# Patient Record
Sex: Female | Born: 1935 | Race: White | Hispanic: No | Marital: Married | Smoking: Never smoker
Health system: Southern US, Community
[De-identification: ages and names within clinical notes are randomized; demographics above are authoritative.]

## PROBLEM LIST (undated history)

## (undated) DIAGNOSIS — F0390 Unspecified dementia without behavioral disturbance: Secondary | ICD-10-CM

## (undated) DIAGNOSIS — K922 Gastrointestinal hemorrhage, unspecified: Secondary | ICD-10-CM

## (undated) DIAGNOSIS — O2230 Deep phlebothrombosis in pregnancy, unspecified trimester: Secondary | ICD-10-CM

## (undated) DIAGNOSIS — I10 Essential (primary) hypertension: Secondary | ICD-10-CM

## (undated) DIAGNOSIS — R42 Dizziness and giddiness: Secondary | ICD-10-CM

## (undated) DIAGNOSIS — I447 Left bundle-branch block, unspecified: Secondary | ICD-10-CM

## (undated) HISTORY — PX: ABDOMINAL HYSTERECTOMY: SHX81

---

## 2007-12-05 ENCOUNTER — Encounter: Admission: RE | Admit: 2007-12-05 | Discharge: 2007-12-05 | Payer: Self-pay | Admitting: Geriatric Medicine

## 2008-03-05 ENCOUNTER — Inpatient Hospital Stay (HOSPITAL_COMMUNITY): Admission: EM | Admit: 2008-03-05 | Discharge: 2008-03-08 | Payer: Self-pay | Admitting: Emergency Medicine

## 2010-01-27 ENCOUNTER — Encounter: Admission: RE | Admit: 2010-01-27 | Discharge: 2010-01-27 | Payer: Self-pay | Admitting: Geriatric Medicine

## 2010-07-01 ENCOUNTER — Inpatient Hospital Stay (HOSPITAL_COMMUNITY)
Admission: EM | Admit: 2010-07-01 | Discharge: 2010-07-05 | DRG: 312 | Disposition: A | Discharge status: Skilled Nursing Facility | Payer: Medicare Other | Attending: Internal Medicine | Admitting: Internal Medicine

## 2010-07-01 ENCOUNTER — Emergency Department (HOSPITAL_COMMUNITY): Payer: Medicare Other

## 2010-07-01 DIAGNOSIS — I428 Other cardiomyopathies: Secondary | ICD-10-CM | POA: Diagnosis present

## 2010-07-01 DIAGNOSIS — R42 Dizziness and giddiness: Secondary | ICD-10-CM

## 2010-07-01 DIAGNOSIS — W19XXXA Unspecified fall, initial encounter: Secondary | ICD-10-CM | POA: Diagnosis present

## 2010-07-01 DIAGNOSIS — F068 Other specified mental disorders due to known physiological condition: Secondary | ICD-10-CM | POA: Diagnosis present

## 2010-07-01 DIAGNOSIS — R55 Syncope and collapse: Principal | ICD-10-CM | POA: Diagnosis present

## 2010-07-01 DIAGNOSIS — I10 Essential (primary) hypertension: Secondary | ICD-10-CM | POA: Diagnosis present

## 2010-07-01 DIAGNOSIS — N390 Urinary tract infection, site not specified: Secondary | ICD-10-CM | POA: Diagnosis present

## 2010-07-01 DIAGNOSIS — E785 Hyperlipidemia, unspecified: Secondary | ICD-10-CM | POA: Diagnosis present

## 2010-07-01 DIAGNOSIS — Z7901 Long term (current) use of anticoagulants: Secondary | ICD-10-CM

## 2010-07-01 DIAGNOSIS — Z86718 Personal history of other venous thrombosis and embolism: Secondary | ICD-10-CM

## 2010-07-01 DIAGNOSIS — R079 Chest pain, unspecified: Secondary | ICD-10-CM

## 2010-07-01 DIAGNOSIS — R0789 Other chest pain: Secondary | ICD-10-CM | POA: Diagnosis present

## 2010-07-01 LAB — URINE MICROSCOPIC-ADD ON

## 2010-07-01 LAB — CBC
HCT: 39 % (ref 36.0–46.0)
MCH: 31.5 pg (ref 26.0–34.0)
Platelets: 137 10*3/uL — ABNORMAL LOW (ref 150–400)
RDW: 13.7 % (ref 11.5–15.5)
WBC: 7.4 10*3/uL (ref 4.0–10.5)

## 2010-07-01 LAB — POCT I-STAT, CHEM 8
BUN: 21 mg/dL (ref 6–23)
Calcium, Ion: 1.18 mmol/L (ref 1.12–1.32)
Chloride: 110 mEq/L (ref 96–112)
Creatinine, Ser: 1.2 mg/dL (ref 0.4–1.2)
Glucose, Bld: 109 mg/dL — ABNORMAL HIGH (ref 70–99)
Sodium: 141 mEq/L (ref 135–145)
TCO2: 23 mmol/L (ref 0–100)

## 2010-07-01 LAB — DIFFERENTIAL
Basophils Absolute: 0 10*3/uL (ref 0.0–0.1)
Eosinophils Absolute: 0 10*3/uL (ref 0.0–0.7)
Eosinophils Relative: 0 % (ref 0–5)
Lymphocytes Relative: 8 % — ABNORMAL LOW (ref 12–46)
Monocytes Relative: 7 % (ref 3–12)
Neutro Abs: 6.2 10*3/uL (ref 1.7–7.7)
Neutrophils Relative %: 84 % — ABNORMAL HIGH (ref 43–77)

## 2010-07-01 LAB — PROTIME-INR
INR: 2.08 — ABNORMAL HIGH (ref 0.00–1.49)
Prothrombin Time: 23.5 seconds — ABNORMAL HIGH (ref 11.6–15.2)

## 2010-07-01 LAB — CK TOTAL AND CKMB (NOT AT ARMC)
CK, MB: 3.9 ng/mL (ref 0.3–4.0)
Relative Index: 2.4 (ref 0.0–2.5)
Total CK: 162 U/L (ref 7–177)

## 2010-07-01 LAB — D-DIMER, QUANTITATIVE: D-Dimer, Quant: 9.36 ug/mL-FEU — ABNORMAL HIGH (ref 0.00–0.48)

## 2010-07-01 LAB — URINALYSIS, ROUTINE W REFLEX MICROSCOPIC
Nitrite: NEGATIVE
Protein, ur: NEGATIVE mg/dL
Urobilinogen, UA: 0.2 mg/dL (ref 0.0–1.0)

## 2010-07-01 LAB — POCT CARDIAC MARKERS: Myoglobin, poc: 219 ng/mL (ref 12–200)

## 2010-07-01 LAB — TROPONIN I: Troponin I: 0.01 ng/mL (ref 0.00–0.06)

## 2010-07-02 DIAGNOSIS — I10 Essential (primary) hypertension: Secondary | ICD-10-CM

## 2010-07-02 DIAGNOSIS — I059 Rheumatic mitral valve disease, unspecified: Secondary | ICD-10-CM

## 2010-07-02 DIAGNOSIS — I951 Orthostatic hypotension: Secondary | ICD-10-CM

## 2010-07-02 LAB — BASIC METABOLIC PANEL
CO2: 21 mEq/L (ref 19–32)
Calcium: 8.9 mg/dL (ref 8.4–10.5)
GFR calc Af Amer: >60 mL/min (ref >60)
Sodium: 140 mEq/L (ref 135–145)

## 2010-07-02 LAB — CK TOTAL AND CKMB (NOT AT ARMC)
CK, MB: 2.6 ng/mL (ref 0.3–4.0)
Relative Index: 2.2 (ref 0.0–2.5)
Total CK: 132 U/L (ref 7–177)
Total CK: 117 U/L (ref 7–177)

## 2010-07-02 LAB — CBC
HCT: 36.3 % (ref 36.0–46.0)
MCHC: 32.8 g/dL (ref 30.0–36.0)
MCV: 96.3 fL (ref 78.0–100.0)
RDW: 14.2 % (ref 11.5–15.5)
WBC: 5.1 10*3/uL (ref 4.0–10.5)

## 2010-07-02 LAB — PROTIME-INR
INR: 2.23 — ABNORMAL HIGH (ref 0.00–1.49)
Prothrombin Time: 24.8 seconds — ABNORMAL HIGH (ref 11.6–15.2)

## 2010-07-03 DIAGNOSIS — I359 Nonrheumatic aortic valve disorder, unspecified: Secondary | ICD-10-CM

## 2010-07-03 LAB — PROTIME-INR
INR: 2.55 — ABNORMAL HIGH (ref 0.00–1.49)
Prothrombin Time: 27.5 seconds — ABNORMAL HIGH (ref 11.6–15.2)

## 2010-07-03 LAB — URINE CULTURE: Culture  Setup Time: 201202021653

## 2010-07-04 LAB — PROTIME-INR
INR: 2.58 — ABNORMAL HIGH (ref 0.00–1.49)
Prothrombin Time: 27.8 seconds — ABNORMAL HIGH (ref 11.6–15.2)

## 2010-07-05 DIAGNOSIS — I5021 Acute systolic (congestive) heart failure: Secondary | ICD-10-CM

## 2010-07-05 LAB — PROTIME-INR: Prothrombin Time: 27.4 seconds — ABNORMAL HIGH (ref 11.6–15.2)

## 2010-07-10 NOTE — H&P (Signed)
NAMEJENETTE, RAYSON NO.:  [id number]  MEDICAL RECORD NO.:  [id number]           PATIENT TYPE:  E  LOCATION:  [facility]                         FACILITY:  [facility]  PHYSICIAN:  Lonia Blood, M.D.       DATE OF BIRTH:  [birth year]  DATE OF ADMISSION:  07/01/2010 DATE OF DISCHARGE:                             HISTORY & PHYSICAL   PRIMARY CARE PHYSICIAN:  Hal T. Stoneking, MD  CHIEF COMPLAINT:  Fall.  HISTORY OF PRESENT ILLNESS:  Mrs. Alois Cliche is a 77 year old woman with history of DVT, PE, IVC filter, mild dementia, who reports that she fell at home after waking up from a nap.  She said that she woke up from a nap and felt really dizzy and when she tried to get up she got really, really dizzy and then fell down to the floor.  She then has been experiencing some sharp left-sided chest pain every time she takes a deep breath.  She was taken to the emergency room where she had a chest x-ray not showing any pneumothorax or fractured ribs.  She currently has no dizziness, focal weakness, or numbness, or shortness of breath.  PAST MEDICAL HISTORY:  DVT, PE, status post IVC filter, history of a GI bleed from gastric erosions, bilateral hydronephrosis, and obstructive renal calculi, hypertension, hyperlipidemia, mild dementia.  HOME MEDICATIONS:  Unclear.  She thinks she is still taking Coumadin but she does not know any of the others.  She reports she gave a list to EMS but unfortunately the list did not make it to the hospital.  We have a call in to Dr. Tacy Learn office to get the medication list.  ALLERGIES:  Penicillin.  SOCIAL HISTORY:  The patient is married.  She lives in the independent retirement community of [place].  Her husband is currently in a nursing home for short-term rehabilitation.  She does not smoke, does not drink any alcohol.  FAMILY HISTORY:  Positive for hypertension.  REVIEW OF SYSTEMS:  As per HPI.  All other systems reviewed  are negative.  PHYSICAL EXAMINATION:  VITAL SIGNS:  Upon admission temperature is 97.4, heart rate 63, blood pressure 150/80, respirations 16, saturation 94-95% on room air. GENERAL:  The patient is alert, oriented, in no acute distress. HEENT:  Head is normocephalic, atraumatic.  Eyes, pupils equal, round, reactive to light and accommodation.  Extraocular movement is intact. Throat clear. NECK:  Supple.  No JVD. CHEST:  Clear to auscultation without wheezes, rhonchi, or crackles. HEART:  Regular rate and rhythm without murmurs, rubs, or gallops. ABDOMEN:  Soft, nontender, nondistended.  Bowel sounds are present. EXTREMITIES:  Lower extremities without edema. SKIN:  Warm, dry.  No suspicious-looking rashes.  LABORATORY VALUES ON ADMISSION:  White blood cell count 7.4, hemoglobin 13, platelet count 137.  Sodium 141, potassium 4, BUN 21, creatinine 1.2.  Urinalysis positive for leukocyte esterase, bacteria.  Two sets of cardiac enzymes are within normal limits.  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  Chest x-ray shows no infiltrates.  ASSESSMENT AND PLAN: 1. This is a 77 year old woman presenting after a fall that was     preceded by some dizziness.  Her dizziness could be a     cerebrovascular event, but I feel like that is unlikely.  She could     have vertebrobasilar insufficiency or orthostasis.  I am going to     go ahead and admit her to the hospital, check orthostatic vital     signs, obtain home medications, monitor her neurological status     closely.  Obtain Physical Therapy and Occupational Therapy     evaluation. 2. Chest pain.  This is consistent with musculoskeletal type chest     pain from the injury.  I am worried about possibility of a rib     fracture.  I am going to obtain a chest x-ray with rib detail film,     start Tylenol  round-the-clock, use low-dose narcotic. 3. History of deep vein thrombosis, pulmonary embolism.  The patient     clearly states that the chest pain was after a fall not before the     fall.  Nevertheless, with a history of thromboembolic disease, this     is always a possibility.  I am going to check a D-dimer.  Check     PT/INR to see if the Coumadin is therapeutic and obtain CT     angiogram of the chest if INR is low, D-dimer is very high. 4. Reported mild dementia.  The patient seems to be quite focused,     oriented, able to answer commands and knows what is going on with     her husband as well as her family.  If she has dementia, it is     definitely very mild.     Lonia Blood, M.D.     SL/MEDQ  D:  07/01/2010  T:  07/01/2010  Job:  [id number]  cc:   Hal T. Stoneking, M.D.  Electronically Signed by Lonia Blood M.D. on 07/05/2010 07:39:44 AM

## 2010-07-13 NOTE — Discharge Summary (Signed)
NAMEREVER, PICHETTE NO.:  [id number]  MEDICAL RECORD NO.:  [id number]           PATIENT TYPE:  I  LOCATION:  4502                         FACILITY:  [facility]  PHYSICIAN:  Lonia Blood, M.D.       DATE OF BIRTH:  [birth year]  DATE OF ADMISSION:  07/01/2010 DATE OF DISCHARGE:  07/05/2010                              DISCHARGE SUMMARY   PRIMARY CARE PHYSICIAN:  Hal T. Stoneking, MD  DISCHARGE DIAGNOSES: 1. Fall. 2. Probable syncope. 3. Newly-diagnosed cardiomyopathy with ejection fraction of 30% and     probably significant aortic stenosis as well as pulmonary     hypertension - no further plans for workup per the patient and     family. 4. History of deep venous thrombosis and pulmonary embolism with IVC     filter in, on chronic anticoagulation. 5. Urinary tract infection. 6. Mild-to-moderate dementia. 7. History of gastrointestinal bleed. 8. Hypertension. 9. Hyperlipidemia.  DISCHARGE MEDICATIONS: 1. Tylenol 500 mg by mouth three times a day. 2. Cefuroxime 500 mg by mouth twice a day for 2 days. 3. Colace 100 mg by mouth twice a day. 4. Aricept 10 mg by mouth at bedtime. 5. Ensure 1 can by mouth three times a day. 6. Crestor 20 mg daily. 7. Zoloft 50 mg daily. 8. Tylenol with Codeine 300/30 one tablet by mouth every 6 hours as     needed for pain. 9. Coumadin 3 mg daily.  CONDITION ON DISCHARGE:  Ms. Glorie Dowlen will be discharged to skilled nursing [facility] where she will be reunited with her husband.  She is do not resuscitate as per her wishes as well as her family wishes.  PROCEDURE THIS ADMISSION: 1. 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 2. Chest x-ray, two views, with no active disease. 3. Transthoracic echocardiogram on July 02, 2010 with  ejection     fraction of 30% with diffuse hypokinesis, probable severe aortic     stenosis, pulmonary artery pressure of 51 mmHg, abnormal left     ventricular relaxation, grade 1 diastolic dysfunction.  HISTORY AND PHYSICAL:  Refer to dictated H and P by Dr. Lavera Guise.  CONSULTATIONS:  The patient was seen in consultation by [facility]  HOSPITAL COURSE: 1. Ms. Alois Cliche is a 77 year old woman with a history of dementia, DVT,     PE, on Coumadin presented after she had a fall at the retirement     community.  She thinks that she also passed out, but this was     though unclear for sure though she had her fall after she went from     sitting to standing.  Episode of dizziness and loss of     consciousness prior to admission.  We have observed Ms. O'Brien in     the hospital for 5 days without any further episodes of loss of  consciousness.  We checked her orthostatics a few times and she     actually did not show signs of orthostasis.  We do notice that the     patient was markedly bradycardic with 50s and 60s throughout the     hospitalization.  We decided then to discontinue the metoprolol.     We have also discontinued Namenda and kept her on Aricept alone as     the only anti Alzheimer medication currently.  The full evaluation     of the patient's syncopal event revealed the presence of a     cardiomyopathy with depressed ejection fraction and probably severe     aortic stenosis.  It is unclear if the cardiomyopathy was the     source of the patient's episode of dizziness and fall.  After     presenting the patient and her family with the findings of the     cardiomyopathy and potential for a cardiac catheterization, open     heart surgery, valve replacement, they elected to pursue medical     management, do not resuscitate.  Of note, Ms. Alois Cliche is completely     asymptomatic without any chest pain or shortness of breath that is     attributable to her heart  condition. Throughout this admission, Ms. O'Brien complained of sharp left-sided chest pain which is clearly related to the trauma sustained from the fall.  Even though the chest x-ray did not show fractured rib on the left, the clinical scenario in the patient's pain is highly suggestive of a occult fracture of the left third rib.  The patient was treated with Tylenol round-the-clock which seems to be controlling the pain fairly decently. 1. Mild-to-moderate dementia, fall, need for 24-hour supervision.  Ms.     Alois Cliche was evaluated by physical therapist and occupational     therapist and felt that she will be good candidate for short-term     skilled nursing home placement.  Further down the road, the patient     will need to be moved to assisted living as she cannot live alone     independent. 2. History of DVT and PE with an IVC filter in.  The patient's     Coumadin was therapeutic throughout this admission.  On admission,     the patient's INR was 2.0.  We continued Coumadin on 3 mg daily     without any adjustments. 3. Urinary tract infection.  Even though the culture grew multiple     morphotypes, we elected to treat the patient with antibiotic at     this bacterial colonization could have been the source for her     fall.  The patient received 3 days of intravenous Rocephin, 1 day     of oral cefuroxime, and she will complete two more days of oral     cefuroxime in the nursing home and then stop. 4. D-dimer of 9.3 on admission.  This is probably due to the acute     stress reaction post fall.  The patient was not septic.  With an     IVC filter in and an INR of 2, we elected not to look for pulmonary     emboli, although it is not impossible that she may have had one.     She was not hypoxic or tachycardic at all during this admission and     the chest pain is most likely a chest wall pain from the fall.  Lonia Blood, M.D.     SL/MEDQ  D:  07/05/2010  T:  07/05/2010   Job:  [id number]  cc:   Hal T. Stoneking, M.D.  Electronically Signed by Lonia Blood M.D. on 07/06/2010 07:02:26 PM

## 2010-08-13 NOTE — Consult Note (Signed)
NAMEHALLEIGH, COMES              ACCOUNT NO.:  [id number]  MEDICAL RECORD NO.:  [id number]           PATIENT TYPE:  I  LOCATION:  4502                         FACILITY:  [facility]  PHYSICIAN:  Jesse Sans. Kirti Carl, MD, FACCDATE OF BIRTH:  November 16, 1917  DATE OF CONSULTATION: DATE OF DISCHARGE:                                CONSULTATION   PRIMARY CARE PHYSICIAN:  Hal T. Pete Glatter, MD with [employer].  CHIEF COMPLAINT:  Dizziness and left-sided chest pain.  HISTORY OF PRESENT ILLNESS:  Ms. Rebecca Dawson is a 77 year old woman with past medical history of pulmonary embolism in 2009, status post IVC filter and on Coumadin since then, no other cardiac history, and hypertension, who was brought to the ER from her assisted living facility where she was found to be on the floor by the staff today morning.  She was awake but was unable to get up because of dizziness, left-sided sharp pain, and weakness.  The patient remembers skipping her supper yesterday evening and was sleeping on sofa and try to get up when she felt the room spinning around her.  She fell down after that but could not get up again because of dizziness or was not able to call someone at that time.  The patient thinks that she passed out after that.  The next thing she remembers is being awoken up in the morning by the staff at the assisted living facility.  She denied any symptom of chest pain, flushing, shortness of breath, flu-like symptoms, nausea, vomiting, or diarrhea prior to the event.  She says that this has never happened to her before.  Currently, she is complaining of severe left- sided chest pain that is not radiating anywhere and is located around her nipple.  The pain is aggravated by palpation and movement.  She denies any shortness of breath or any other symptoms at this time.  PAST MEDICAL HISTORY: 1. Hypertension. 2. Pulmonary embolism in 2009, status post IVC filter. 3. Dementia. 4. Osteoarthritis. 5.  Hyperlipidemia. 6. Depression.  The patient does not have any other cardiac history.  MEDICATIONS AT HOME: 1. Aricept 10 mg 1 tablet by mouth at bedtime. 2. Coumadin dose unknown. 3. Crestor 20 mg 1 tablet by mouth. 4. Metoprolol 25 mg 1/2 tablet by mouth twice a day. 5. Namenda 10 mg twice a day. 6. Sertraline 50 mg daily. 7. Tylenol with Codeine combination as needed for pain.  SOCIAL HISTORY:  The patient lives in [facility] with her husband.  The husband was not present with her last night as he was in rehabilitation center elsewhere.  The patient says that she has been living at this facility for the last 2 years.  The patient denies tobacco abuse, alcohol, or illegal drug use.  The patient says she eats regularly and keeps herself well-hydrated with water, juice, and milk.  FAMILY HISTORY:  The patient denies any cardiac problems in the family. She also denies any other cancers or any other medical problems in her parents or her siblings.  REVIEW OF SYMPTOMS:  All other review of symptom is negative except as per HPI.  ALLERGIES:  The patient is allergic to PENICILLIN which causes a rash.  PHYSICAL EXAMINATION:  VITAL SIGNS:  Temperature 97.4, pulse 58, respirations 60, blood pressure 150/80, oxygen saturation 94% on room air.  Orthostatic vitals; blood pressure in the lying position 178/77, sitting 161/86, and standing 122/64. GENERAL:  Awake, lying in bed, in acute distress from left-sided chest pain. HEENT:  Pupils equal, reactive to light.  Extraocular muscles intact. NECK:  Supple.  No JVD.  No lymphadenopathy. CVS:  Regular rate and rhythm.  No murmurs. LUNGS:  Clear to auscultation bilaterally.  Chest Ioannis Schuh, the patient has severe tenderness on the left-sided of chest near her nipple area on palpation.  She says she has been having this pain since she fell down last night. SKIN:  No rash. ABDOMEN:  Soft, nontender,  nondistended. EXTREMITIES:  No edema.  Good pulsations bilaterally. MUSCULOSKELETAL:  No joint deformity. NEUROLOGIC:  Alert and oriented x3.  Cranial nerves II-XII intact grossly.  Strength grossly intact in all 4 extremities.  Reflexes 2+ bilaterally.  Sensations good bilaterally.  RADIOLOGY: 1. One-view chest x-ray was normal. 2. CT angio of the chest did not show any pulmonary embolism. 3. Noncontrast CT head shows old right thalamic and parietal lobe     infarct, no new changes. 4. EKG shows a rate of 61 per minute with a normal sinus rhythm,     normal axis, no hypertrophy, new left bundle-branch block that has     not been seen on the previous EKG in August 2011.  LABS:  Sodium 141, potassium 4, chloride 110, bicarb 23, BUN 21, creatinine 1.2, glucose 109.  Hemoglobin 13, white count 7.4, platelet 137.  Point-of-care cardiac markers and 1 set of cardiac marker negative for any evidence of acute coronary syndrome.  ASSESSMENT AND PLAN:  Ms. Rebecca Dawson is a 77 year old woman with past medical history of hypertension and pulmonary embolism, who presented to the ED with possible syncope, dizziness, and left-sided chest pain after a fall.  She does not remember if she hit her chest when she fell down last night.  She does have a new left bundle-branch on the EKG, although cardiac enzymes have been negative.  She is orthostatic currently in the ED after 250 mL of normal saline bolus given in the emergency room.  She has been on beta-blocker at home.  1. Syncope.  This is probably secondary to her orthostasis in the     setting of dehydration and beta-blocker use.  Consider checking 1     more cardiac enzyme and EKG in the morning tomorrow to rule out     acute coronary syndrome.  Provide gentle fluid hydration during her     hospital stay here.  Also, provide guidance regarding prevention of     further events of fall at home, like getting up gradually from a     seated position,  the possible use of compression stocking in case     she experiences further dizziness or lightheadedness, calf     compression while in seated position before anticipated activity     and likes. 2. Hypertension.  Please reduce the patient's metoprolol dose prior to     discharge.  [facility] is admitting the patient at this time.  We will follow the patient with you.  Thank you for this consultation.     Bethel Born, MD   ______________________________ Jesse Sans Daleen Squibb, MD, [facility]    MD/MEDQ  D:  07/01/2010  T:  07/02/2010  Job:  [id number]  Electronically Signed by Bethel Born  on 08/09/2010 06:43:20 AM Electronically Signed by Valera Castle MD [facility] on 08/13/2010 09:17:50 AM

## 2010-10-12 NOTE — Discharge Summary (Signed)
Rebecca Dawson, Rebecca Dawson NO.:  [id number]   MEDICAL RECORD NO.:  [id number]          PATIENT TYPE:  INP   LOCATION:  1405                         FACILITY:  [facility]   PHYSICIAN:  Ramiro Harvest, MD    DATE OF BIRTH:  [birth year]   DATE OF ADMISSION:  03/05/2008  DATE OF DISCHARGE:  03/08/2008                               DISCHARGE SUMMARY   PRIMARY CARE PHYSICIAN:  Hal T. Pete Glatter, M.D. of [facility].   DISCHARGE DIAGNOSES:  1. Coffee-ground emesis/upper gastrointestinal bleed felt to be      secondary to nonbleeding erosions secondary to emesis.  2. Left-sided rib pain, musculoskeletal in nature.  3. Urinary tract infection.  4. Hypertension.  5. History of pulmonary embolism/deep venous thrombosis.  6. Hypertension.  7. Arthritis.  8. History of dementia.  9. Depression.  10.Hyperlipidemia.  11.Mild bilateral hydronephrosis without urethral dilatation.   DISCHARGE MEDICATIONS:  1. Ceftin 250 mg p.o. b.i.d. x4 days.  2. Protonix 40 mg p.o. daily  3. Percocet 5/325 1 tablet p.o. q.4 h. p.r.n.  4. Lopressor 25 mg p.o. b.i.d.  5. Crestor 20 mg p.o. daily.  6. Zoloft 50 mg p.o. daily.  7. Aricept take as previously.  8. Coumadin take as previously taken, 3 mg on day 1; 3 mg on day 2; 4      mg on day 3; then repeat again.  9. Calcium take as previously taken.   DISPOSITION/FOLLOW UP:  1. The patient will be discharged home.  2. Will need to follow up with PCP in 1 week.  3. The patient will need a repeat UA for resolution of urinary tract      infection.  4. The patient will also need a referral for outpatient urology for      further evaluation and recommendations for mild bilateral      hydronephrosis without urethral dilatation.  5. The patient will also need to follow up at [facility] office on March 10, 2008 or March 11, 2008 to get an INR checked and Coumadin      dose adjusted per PCP.  6. On followup, the patient also need a  musculoskeletal rib pain      addressed again.   CONSULTATIONS:  Gastroenterology consult was done.  The patient was seen  in consultation by Dr. Madilyn Fireman of [facility] gastroenterology on March 05, 2008.   PROCEDURES PERFORMED:  1. IVC filter was placed per interventional radiology on March 05, 2008.  2. Upper endoscopy was performed on March 05, 2008 by Dr. Madilyn Fireman of      [facility] which showed an ambiguous, small, flat erythematous area      in the proximal stomach possibly resulting from trauma or vomiting      and the patient on Coumadin.  Otherwise, no potential bleeding      sources or active bleeding seen.  Expectant management only.  3. Chest x-ray was performed on March 05, 2008 which showed no active      lung disease.  Slight hyperaeration and mild  cardiomegaly.  4. CT of the abdomen and pelvis was done on March 05, 2008 which      showed bilateral nonobstructing renal calculi present.  No urethral      stones are noted.  Mild bilateral hydronephrosis without urethral      dilatations.  Cause for mild hydronephrosis is not identified in      this study.  Low attenuation lesion in the liver is likely a cyst.      No acute findings in the pelvis.   BRIEF ADMISSION HISTORY/PHYSICAL:  Rebecca Dawson is an 77 year old female  who for the past day prior to admission had been having severe left  upper quadrant pain which brought her to the emergency department.  In  the ED, the patient underwent CT scan of the abdomen that showed  bilateral hydronephrosis which was mild and left renal nonobstructing  calculi.  The patient was given some Dilaudid and Reglan as she was  having some nausea.  The patient was going to be discharged home when  she started to have coffee-ground emesis.  Per patient, was not quite  __________.  She had this for a couple of days or so.  The patient was a  little confused with a history of dementia.  Patient's husband was at  bedside, but was also unsure.   The patient does have a history of DVT  and per husband, blood clots to the lung for which she was taking  Coumadin followed by her PCP, Dr. Pete Glatter.  The patient has a history  of high blood pressure and arthritis.  Denies any aspirin use, Aleve or  Motrin.  No Goody Powders use or over-the-counter pain killers.  The  patient denied any chest pain, no shortness of breath, no fever, no  chills, no diarrhea.  The patient did notice for the past few days her  stool was darker than usual, but denied black stool or overt melena.  No  bright red blood per rectum.   PHYSICAL EXAMINATION:  Physical exam per admitting physician:  VITAL SIGNS:  Temperature 96.5, blood pressure 166/78, pulse 57,  respirations 17, satting 97% on room air.  GENERAL:  The patient was an elderly female and appeared to be in no  acute distress, somewhat pale, lying down in the bed.  HEENT:  Normocephalic, atraumatic.  Pupils equal, round and reactive to  light.  Extraocular movements intact.  Oropharynx was clear.  No  lesions.  No exudates.  NECK:  Supple.  No lymphadenopathy.  There were some telangiectasias on  her lips.  RESPIRATORY:  Lungs were clear to auscultation bilaterally.  CARDIOVASCULAR:  Bradycardic, regular, no murmurs could be appreciated.  ABDOMEN:  Soft, left upper quadrant tenderness.  There was rib  tenderness in the left lower ribs which was reproducible.  Abdomen was  otherwise soft, no guarding.  Positive bowel sounds.  Nondistended and  soft.  LOWER EXTREMITIES:  No clubbing, cyanosis or edema, 5/5 in both  bilateral upper and lower extremities.  NEUROLOGICAL:  Alert and oriented x3.  Cranial nerves II-XII are grossly  intact.  No focal deficits.   LABORATORY DATA:  Admission labs CBC:  White count 6.5, hemoglobin 13,  platelets 157, sodium of 142, potassium 4.0, chloride of 110, glucose of  97, BUN 23, creatinine 0.9, PT 28.9, INR 2.5.  Gastric occult blood was  positive.  UA was  remarkable for mild hematuria.  UA was yellow, cloudy,  specific gravity 1.008, pH of 7.5,  glucose negative, bilirubin negative,  ketones negative, blood large, protein negative, urobilinogen 0.2,  nitrite negative, leukocytes moderate.  Microscopy 3-6 white blood  cells, RBCs 7-10.   Discharge labs:  PT of 23.9, INR of 2.0.  CBC:  White count 5.0,  hemoglobin 12.5, platelets 154, hematocrit 36.3.   HOSPITAL COURSE:  1. Upper gastrointestinal bleed/coffee ground emesis.  The patient was      admitted to telemetry.  The patient had been on Coumadin and as      such she was given some FFP and her Coumadin was held.  It was felt      the patient's upper GI bleed could have been secondary to a Mallory-      Weiss tear versus a Cameron lesion.  The patient was placed on      Protonix 40 b.i.d.  Two large-bore IVs were placed.  The patient      was placed also on IV fluids.  CBCs were followed throughout the      hospitalization and the patient's hemoglobin remained stable.      Interventional radiology was consulted.  An IVC filter was placed      as the patient's Coumadin was going to be held for prevention of      further PE as the patient did have a history of DVT and PE.  A      gastroenterology consult was done.  The patient was seen in      consultation by Dr. Madilyn Fireman.  An upper endoscopy was done with      results as stated above.  It was felt the patient did not have any      significant upper GI bleed and as such, the patient was treated      expectantly.  The patient was continued on IV fluids and placed on      Protonix during the hospitalization.  The patient did not have any      more further episodes of coffee-ground emesis.  The patient's      hemoglobin remained stable throughout the hospitalization.  The      patient will be discharged in stable and improved condition.  On      day of discharge, the patient's hemoglobin was 12.5.  The patient's      Coumadin was restarted.  The patient's INR remained therapeutic and      the patient did not have any more episodes of bleeding.  2. Left upper quadrant pain.  It was felt that patient's left upper      quadrant pain was more musculoskeletal in nature as it was more      reproducible on the left lower ribs.  Chest x-ray did not show any      rib fracture.  The patient was treated expectantly and      supportively.  She was placed on Percocet for pain management.      Warm compresses were applied to the left rib area.  The patient's      pain improved during the hospitalization.  The patient will be      discharged home on Percocet as needed.  The patient will need      follow up with her PCP. 3.  Urinary tract infection.  During the      hospitalization, the patient was noted on urinalysis to have a      possible urinary tract infection.  The patient was placed on IV  Rocephin.  Urine cultures was obtained which grew greater than      100,000 of multiple bacterial morphotypes.  The patient was      maintained on the Rocephin which was then changed to Ceftin.  It      was felt that due to her age and her comorbidities, the patient      will be treated for  the urinary tract infection to complete a 7      day course.  A repeat UA will need to be done per PCP for followup      on this.  3. Hypertension, stable throughout the hospitalization.  Once the      patient was hydrated with IV fluids, the patient's Lopressor was      restarted.  The patient's pressures remained stable throughout the      hospitalization and the patient will be discharged on her home      regimen.  4. History of pulmonary embolism/deep venous thrombosis.  On admission      secondary to the patient's upper GI bleed, the patient's Coumadin      was held.  Interventional radiology was consulted and IVC filter      was placed.  The patient did have an upper endoscopy which was done      which did not reveal any active bleeding sites.  The  patient's      Coumadin was then restarted.  INR was kept therapeutic throughout      the hospitalization.  On day of discharge, the patient's INR was      2.0.  The patient will need followup early next week with her PCP      to check her INR levels and adjust the Coumadin appropriately.  The      patient will be discharged home on her regular home regimen of      Coumadin.  The patient remained stable throughout the      hospitalization.  5. Mild bilateral hydronephrosis without urethral dilatation and      nonobstructing renal calculi were noted on a CT of the abdomen and      pelvis.  The patient's renal function remained stable.  The patient      did not have any obstructing stones.  Urology was curbside and it      was felt that the patient could be treated as an outpatient.  The      patient will need a referral to urology for followup on this mild      bilateral hydronephrosis.  6. The rest of the patient's chronic medical issues remained stable      throughout the hospitalization.   DISPOSITION:  The patient will be discharged in stable and improved  condition.  On day of discharge vital signs:  Temperature 97.4, pulse of  54, blood pressure 144/73, respiratory rate 16, satting 94% on room air.   It has been a pleasure taking care of Rebecca Dawson.      Ramiro Harvest, MD  Electronically Signed     DT/MEDQ  D:  03/08/2008  T:  03/08/2008  Job:  [id number]   cc:   Hal T. Stoneking, M.D.  Fax: [phone]

## 2010-10-12 NOTE — Consult Note (Signed)
Rebecca Dawson, Rebecca Dawson              ACCOUNT NO.:  [id number]   MEDICAL RECORD NO.:  [id number]          PATIENT TYPE:  INP   LOCATION:  1405                         FACILITY:  [facility]   PHYSICIAN:  John C. Madilyn Fireman, M.D.    DATE OF BIRTH:  Mar 08, 1918   DATE OF CONSULTATION:  03/05/2008  DATE OF DISCHARGE:                                 CONSULTATION   We were consulted for coffee-ground emesis by Dr. Ramiro Harvest.   HISTORY OF PRESENT ILLNESS:  This is a very pleasant, mildly demented 17-  year-old who moved to [place] 4 months ago after her husband had a  fall and they needed to move to an assisted living community.  She came  to the [facility] Emergency Room with 5 days of left-sided pain.  This  pain she describes as being in her left rib cage.  It is worse with  movement.  It is not bothered by food.  She denies any emesis or any  other abdominal pain.  Once she was in the emergency room, she had one  episode of coffee-ground emesis.  She denies use of NSAIDs, has not had  any heartburn.  No other recent illness.  She tells me that she is  chronically constipated and takes Colace occasionally to have a bowel  movement.   PAST MEDICAL HISTORY:  Significant for DVT/PE in the last few months.  She is on Coumadin.  She has a history of hypertension, arthritis,  dementia, depression and hyperlipidemia.  She has had a hysterectomy.  Her primary care physician is Dr. Merlene Laughter.   CURRENT MEDICATIONS:  Include Coumadin, calcium, Aricept, Zoloft and  Lopressor.  She has an allergy to penicillin.   REVIEW OF SYSTEMS:  Negative other than the patient is concerned over  her relationship with her daughter, and this seems be causing her a  great deal of stress.   SOCIAL HISTORY:  Negative for alcohol, tobacco and drugs.  She is  married, lives with her husband at an assisted living community.   PHYSICAL EXAM:  She is awake, appropriate, very pleasant to speak to.  Her temperature  is 97.6, pulse 55, respirations 16, blood pressure is  157/64.  She does have some trouble answering some history questions  secondary to forgetfulness, but seems alert, oriented and able to  consent for herself.  HEART:  Has a regular rate and rhythm with systolic murmur.  ABDOMEN:  Soft, nontender, nondistended with positive bowel sounds.  The  patient pinches her left rib cage area and tells me that this is where  it hurts.  When I palpate there, it does not seem to cause her any pain.   LABORATORIES:  She has a hemoglobin of 12.3.  This has been stable  overnight.  Her hematocrit is 36.1, white count 4.8, platelets 151,000.  Her INR is 2.5.  Her PT is 28.9 BMET is significant for a BUN of 18 over  a creatinine of 0.91.  Glucose is168.  Her LFTs are normal other than a  mildly elevated bilirubin at 1.3.  She is Gastroccult positive.  A CT of  her abdomen and pelvis done today showed mild bilateral hydronephrosis.   ASSESSMENT:  Dr. Dorena Cookey has seen and examined the patient, collected  a history and reviewed her chart.  His impression is that this is an 77-  year-old very mildly demented female with 5 days of pain in her left  upper quadrant,  one episode of coffee-ground emesis, seems very stable.  We will take  for an upper endoscopy this afternoon to try to evaluate the source of  her coffee-ground emesis.  Agree with proton pump inhibitor therapy.  We  will follow with you.  Thanks very much for this consultation.      Stephani Police, PA    ______________________________  Everardo All Madilyn Fireman, M.D.    MLY/MEDQ  D:  03/05/2008  T:  03/05/2008  Job:  [id number]   cc:   Hal T. Stoneking, M.D.  Fax: [phone]   Everardo All. Madilyn Fireman, M.D.  Fax: [phone]

## 2010-10-12 NOTE — H&P (Signed)
NAMENARIA, ABBEY NO.:  [id number]   MEDICAL RECORD NO.:  [id number]          PATIENT TYPE:  EMS   LOCATION:  ED                           FACILITY[facility]  [facility]   PHYSICIAN:  Michiel Cowboy, MDDATE OF BIRTH:  Jun 07, 1917   DATE OF ADMISSION:  03/05/2008  DATE OF DISCHARGE:                              HISTORY & PHYSICAL   PRIMARY CARE Iverna Hammac:  Dr. Pete Glatter.   CHIEF COMPLAINT:  Left upper quadrant pain.   The patient is an 77 year old female who for the past day or so has been  having severe left upper quadrant pain which brought her into the  emergency department.  In the ED she underwent CT scan of the abdomen  that showed bilateral hydronephrosis which is mild and left renal  nonobstructive calculi.  The patient was given some Dilaudid and Reglan,  and was having some nausea.  She was going to be discharged to home.  Then she started to have coffee-ground emesis.  Per patient it is not  quite clear if she had this for a couple of days now.  She is a little  confused and has history of dementia.  Her husband is at bedside, but  also unsure.  The patient does have history of DVT, and per husband  blood clots to the lung for which she takes Coumadin followed by Dr.  Pete Glatter.  She has a history of high blood pressure and arthritis.  Denies taking any aspirin, Aleve or Motrin, Goody's powder or over-the-  counter pain killers.  Denies any chest pain, shortness of breath.  No  fevers, no chills, no diarrhea.  She does notice that for the past few  days her stool was darker than usual, but denies black stool or overt  melena.  No bright red blood per rectum.   REVIEW OF SYSTEMS:  Otherwise, unremarkable except for as per HPI.   PAST MEDICAL HISTORY:  Significant for:   1. History of PE/DVT a few months ago.  The patient is unsure exactly      when.  2. History of hypertension.  3. History of arthritis.  4. History of dementia.  5. Depression.  6.  Hyperlipidemia.   SOCIAL HISTORY:  The patient does not smoke, drink or use drugs.Marland Kitchen   FAMILY HISTORY:  Noncontributory.   ALLERGIES:  To PENICILLIN.   MEDICATIONS:  1. Coumadin.  She takes 3 mg on days #1, #2, then 4 mg on the next      day, then continue 3 mg on day #1, #2, and then on the 3rd day      takes 4 mg and so on.  2. Zoloft 50 mg once a day.  3. Lopressor 25 mg twice a day.  4. Calcium.  5. Aricept dose unknown, recently started.  6. Crestor 20 mg p.o. daily.   PHYSICAL EXAMINATION:  VITALS:  Temperature 96.5, blood pressure 166/78,  pulse 57, respirations 17, satting 97% on room air, of note her O2 sat  does drop when she tries to sit up.  The patient is an elderly female appears to  be in no acute distress, but  somewhat pale, lying down in bed.  HEAD:  Nontraumatic.  NECK:  Supple.  No lymphadenopathy noted.  There are telangiectasias on  the lips present.  LUNGS:  Clear to auscultation bilaterally.  HEART:  Slow, but regular, no murmurs could be appreciated.  ABDOMEN:  There is left upper quadrant tenderness, and there is rib  tenderness on the left lower ribs which is reproducing her pain.  Abdomen, otherwise, soft.  No guarding is found at present.  LOWER EXTREMITIES:  No clubbing, cyanosis or edema.  Strength 5/5 in all  4 extremities.  Cranial nerves intact.   LABORATORIES:  White blood cell count 6.5, hemoglobin 13, platelets 157.  Sodium 142, potassium 4.0, creatinine 0.9, BUN elevated at 23.  UA  remarkable for mild hematuria, but, otherwise, within normal limits.  INR 2.5.   EKG not obtained.  CT scan of abdomen and pelvis showing mild bilateral  hydronephrosis, cyst in the liver, and nonobstructing left-sided renal  calculi and a small renal calculi in the right, also nonobstructing.   ASSESSMENT/PLAN:  This is an 77 year old female with history of  pulmonary embolism on Coumadin with at least 1 known event of coffee-  ground emesis with elevated  BUN.   1. Likely upper gastrointestinal bleed.  The patient has a hiatal      hernia.  On Coumadin for her deep venous thrombosis/pulmonary      embolism, and had been having vomiting.  Differential includes      Mallory-Weiss tear versus Cameron lesion.  The patient has history      of hiatal hernia versus peptic ulcer disease.  Will give Protonix      40 b.i.d. intravenous, place 2 large-bore peripheral intravenous,      give intravenous fluids, frequent orthostatics.  Currently patient      is not orthostatic.  At this point there is no indication for blood      transfusion.  Will follow CBC and BUN.  The patient is currently on      Coumadin with therapeutic INR secondary to history of pulmonary      embolism per patient would be as recent as a few months ago.  Will      need inferior vena cava filter which was discussed with radiology.      We will need gastrointestinal consult for possible endoscopy. Will      make nothing by mouth except for ice chips until seen by      gastrointestinal.  Will give at least 1 unit of fresh frozen plasma      to bring INR somewhat down so that  patient can undergo the      procedures that she needs.  Given that she has recent history of      pulmonary embolism, they will need inferior vena cava filter.  2. Left upper quadrant pain.  Wonder if this is secondary to her      nonobstructive left renal calculi versus musculoskeletal.  There is      no epigastric pain per se.  Although less likely, we will add on      lipase to labs.  Given that there is rib tenderness, will obtain a      chest x-ray to evaluate for rib fractures.  Will control pain with      Percocet and p.r.n. morphine.  3. History of hypertension.  Will actually decrease Lopressor.      Currently  patient is still  hypertensive.  Will give holding      parameters.  Given upper gastrointestinal bleed, will watch blood      pressure closely.  At this point the patient appears to be       hemodynamically very stable, but actually elevated up blood      pressure.  4. History of dementia.  Will hold Aricept for now as this can cause      nausea and may worsen gastrointestinal bleed if this is secondary      to Mallory-Weiss tear.  Will need to discuss with Dr. Pete Glatter if      other options would be good versus trying again at a receptive      later time.  5. History of hyperlipidemia.  Continue Crestor.  6. History of depression.  Hold off on Zoloft.  This can increase risk      of bleeding.  7. Prophylaxis Protonix, sequential compression devices.  Hold      Coumadin.  8. Code status:  Do not resuscitate, do not intubate.   Dr. Ramiro Harvest to assume care in the morning.      Michiel Cowboy, MD  Electronically Signed     AVD/MEDQ  D:  03/05/2008  T:  03/05/2008  Job:  [id number]   cc:   Hal T. Stoneking, M.D.  Fax: [phone]

## 2010-10-12 NOTE — Op Note (Signed)
Rebecca Dawson, DESHAIES              ACCOUNT NO.:  [id number]   MEDICAL RECORD NO.:  [id number]          PATIENT TYPE:  INP   LOCATION:  1405                         [facility]AC[facility]  [facility]   PHYSICIAN:  John C. Madilyn Fireman, M.D.    DATE OF BIRTH:  05-May-1918   DATE OF PROCEDURE:  DATE OF DISCHARGE:                               OPERATIVE REPORT   INDICATIONS FOR PROCEDURE:  Coffee-ground emesis on one occasion during  this admission with no significant drop in hemoglobin.   PROCEDURE:  The patient was placed in the left lateral decubitus  position and placed on pulse monitor with continuous low-flow oxygen  delivered by nasal cannula.  She was sedated with 25 mcg IV fentanyl and  3 mg IV Versed.  The Olympus video endoscope was advanced under direct  vision into the oropharynx and esophagus.  The esophagus was straight  and of normal caliber.  At the squamocolumnar line at 38 cm, there was  no visible hiatal hernia, ring, stricture, or other abnormality of the  GE junction.  The stomach was entered and a small amount of liquid  secretions were suctioned from the fundus.  Retroflexed view of the  cardia is unremarkable.  Along the lesser curvature in the proximal  body, there were seen 2 fairly intense flat erythematous areas possibly  representing some nonspecific erosions possibly resulting from vomiting.  There was no ulceration or visible suspicion of neoplasm and there did  not appear to be AVMs.  It did not bleed with forceful washing with the  water-filled syringe.  The remainder of the stomach appeared normal.  The pylorus was not deformed and easily allowed passage of the endoscope  tip in the duodenum.  Both the bulb and second portion were inspected  and appeared to be within normal limits.  The scope was then withdrawn  and the patient returned to the recovery room in stable condition.  She  tolerated the procedure well and there were no immediate complications.   IMPRESSION:   Ambiguous small flat erythematous areas in the proximal  stomach possibly resulting from the trauma of vomiting and a patient on  Coumadin.  Otherwise, no potential bleeding sources or active bleeding  seen.   PLAN:  Expectant management alone.  Call us p.r.n. if further GI input  needed.           ______________________________  Everardo All Madilyn Fireman, M.D.     JCH/MEDQ  D:  03/05/2008  T:  03/06/2008  Job:  [id number]

## 2011-03-01 LAB — CROSSMATCH

## 2011-03-01 LAB — URINE CULTURE: Colony Count: >=100000

## 2011-03-01 LAB — DIFFERENTIAL
Basophils Absolute: 0
Basophils Absolute: 0
Basophils Relative: 0
Basophils Relative: 0
Eosinophils Absolute: 0
Lymphocytes Relative: 12
Lymphocytes Relative: 6 — ABNORMAL LOW
Lymphs Abs: 0.4 — ABNORMAL LOW
Monocytes Absolute: 0.3
Monocytes Absolute: 0.5
Monocytes Absolute: 0.5
Monocytes Relative: 10
Monocytes Relative: 4
Monocytes Relative: 7
Neutro Abs: 3.6
Neutro Abs: 5.6
Neutro Abs: 5.8
Neutrophils Relative %: 68
Neutrophils Relative %: 81 — ABNORMAL HIGH
Neutrophils Relative %: 90 — ABNORMAL HIGH

## 2011-03-01 LAB — POCT I-STAT, CHEM 8
BUN: 23
Creatinine, Ser: 0.9
Glucose, Bld: 97
Hemoglobin: 12.6
TCO2: 23

## 2011-03-01 LAB — CBC
HCT: 33.2 — ABNORMAL LOW
HCT: 35.2 — ABNORMAL LOW
HCT: 35.9 — ABNORMAL LOW
HCT: 36.3
HCT: 36.6
HCT: 38.2
HCT: 38.2
Hemoglobin: 12.2
Hemoglobin: 12.2
Hemoglobin: 13
MCHC: 33.6
MCHC: 33.9
MCHC: 33.9
MCHC: 34
MCHC: 34.2
MCHC: 34.3
MCV: 94.2
MCV: 95
MCV: 95.1
MCV: 95.1
MCV: 95.2
MCV: 95.2
MCV: 95.3
MCV: 95.7
Platelets: 138 — ABNORMAL LOW
Platelets: 144 — ABNORMAL LOW
Platelets: 151
Platelets: 151
Platelets: 154
Platelets: 154
Platelets: 160
Platelets: 162
Platelets: 163
RBC: 3.73 — ABNORMAL LOW
RBC: 3.79 — ABNORMAL LOW
RBC: 3.79 — ABNORMAL LOW
RBC: 3.81 — ABNORMAL LOW
RBC: 3.83 — ABNORMAL LOW
RBC: 4.01
RDW: 13.3
RDW: 13.5
RDW: 13.5
RDW: 13.6
RDW: 13.7
RDW: 13.9
WBC: 4.8
WBC: 5
WBC: 5.8
WBC: 6
WBC: 6
WBC: 9.5

## 2011-03-01 LAB — TROPONIN I: Troponin I: <0.01

## 2011-03-01 LAB — COMPREHENSIVE METABOLIC PANEL
Albumin: 2.9 — ABNORMAL LOW
Albumin: 3.4 — ABNORMAL LOW
BUN: 10
BUN: 18
Calcium: 8.8
Creatinine, Ser: 0.91
Creatinine, Ser: 1.03
Glucose, Bld: 168 — ABNORMAL HIGH
Glucose, Bld: 87
Potassium: 4.1
Total Protein: 5.3 — ABNORMAL LOW
Total Protein: 5.9 — ABNORMAL LOW

## 2011-03-01 LAB — PREPARE FRESH FROZEN PLASMA

## 2011-03-01 LAB — URINALYSIS, ROUTINE W REFLEX MICROSCOPIC
Bilirubin Urine: NEGATIVE
Ketones, ur: NEGATIVE
Protein, ur: NEGATIVE
Urobilinogen, UA: 0.2

## 2011-03-01 LAB — BASIC METABOLIC PANEL
BUN: 12
CO2: 27
Chloride: 107
Creatinine, Ser: 0.98
Glucose, Bld: 87
Potassium: 3.8

## 2011-03-01 LAB — PROTIME-INR
INR: 2.5 — ABNORMAL HIGH
INR: 2.6 — ABNORMAL HIGH
Prothrombin Time: 27.4 — ABNORMAL HIGH
Prothrombin Time: 28.9 — ABNORMAL HIGH

## 2011-03-01 LAB — CK TOTAL AND CKMB (NOT AT ARMC)
CK, MB: 2.7
Relative Index: 1.7

## 2011-03-01 LAB — LIPASE, BLOOD: Lipase: 26

## 2011-03-01 LAB — URINE MICROSCOPIC-ADD ON

## 2011-03-01 LAB — GASTRIC OCCULT BLOOD (1-CARD TO LAB): Occult Blood, Gastric: POSITIVE — AB

## 2011-04-13 ENCOUNTER — Emergency Department (HOSPITAL_COMMUNITY): Payer: Medicare Other

## 2011-04-13 ENCOUNTER — Inpatient Hospital Stay (HOSPITAL_COMMUNITY)
Admission: EM | Admit: 2011-04-13 | Discharge: 2011-04-18 | DRG: 690 | Disposition: A | Discharge status: Nursing Home (Includes ICF) | Payer: Medicare Other | Attending: Internal Medicine | Admitting: Internal Medicine

## 2011-04-13 ENCOUNTER — Encounter: Payer: Self-pay | Admitting: Emergency Medicine

## 2011-04-13 DIAGNOSIS — F4321 Adjustment disorder with depressed mood: Secondary | ICD-10-CM | POA: Diagnosis present

## 2011-04-13 DIAGNOSIS — Z79899 Other long term (current) drug therapy: Secondary | ICD-10-CM

## 2011-04-13 DIAGNOSIS — Z880 Allergy status to penicillin: Secondary | ICD-10-CM

## 2011-04-13 DIAGNOSIS — Z7901 Long term (current) use of anticoagulants: Secondary | ICD-10-CM

## 2011-04-13 DIAGNOSIS — Z86718 Personal history of other venous thrombosis and embolism: Secondary | ICD-10-CM

## 2011-04-13 DIAGNOSIS — R339 Retention of urine, unspecified: Secondary | ICD-10-CM | POA: Diagnosis present

## 2011-04-13 DIAGNOSIS — R45851 Suicidal ideations: Secondary | ICD-10-CM

## 2011-04-13 DIAGNOSIS — J438 Other emphysema: Secondary | ICD-10-CM | POA: Diagnosis present

## 2011-04-13 DIAGNOSIS — T45515A Adverse effect of anticoagulants, initial encounter: Secondary | ICD-10-CM | POA: Diagnosis present

## 2011-04-13 DIAGNOSIS — I428 Other cardiomyopathies: Secondary | ICD-10-CM | POA: Diagnosis present

## 2011-04-13 DIAGNOSIS — R001 Bradycardia, unspecified: Secondary | ICD-10-CM | POA: Diagnosis present

## 2011-04-13 DIAGNOSIS — E039 Hypothyroidism, unspecified: Secondary | ICD-10-CM | POA: Diagnosis present

## 2011-04-13 DIAGNOSIS — G8929 Other chronic pain: Secondary | ICD-10-CM | POA: Diagnosis present

## 2011-04-13 DIAGNOSIS — R791 Abnormal coagulation profile: Secondary | ICD-10-CM | POA: Diagnosis present

## 2011-04-13 DIAGNOSIS — R509 Fever, unspecified: Secondary | ICD-10-CM | POA: Diagnosis present

## 2011-04-13 DIAGNOSIS — I447 Left bundle-branch block, unspecified: Secondary | ICD-10-CM | POA: Diagnosis present

## 2011-04-13 DIAGNOSIS — Z23 Encounter for immunization: Secondary | ICD-10-CM

## 2011-04-13 DIAGNOSIS — N390 Urinary tract infection, site not specified: Principal | ICD-10-CM | POA: Diagnosis present

## 2011-04-13 DIAGNOSIS — D696 Thrombocytopenia, unspecified: Secondary | ICD-10-CM | POA: Diagnosis present

## 2011-04-13 DIAGNOSIS — M8448XA Pathological fracture, other site, initial encounter for fracture: Secondary | ICD-10-CM | POA: Diagnosis present

## 2011-04-13 DIAGNOSIS — A498 Other bacterial infections of unspecified site: Secondary | ICD-10-CM | POA: Diagnosis present

## 2011-04-13 DIAGNOSIS — I82409 Acute embolism and thrombosis of unspecified deep veins of unspecified lower extremity: Secondary | ICD-10-CM | POA: Diagnosis present

## 2011-04-13 DIAGNOSIS — I080 Rheumatic disorders of both mitral and aortic valves: Secondary | ICD-10-CM | POA: Diagnosis present

## 2011-04-13 DIAGNOSIS — S32030A Wedge compression fracture of third lumbar vertebra, initial encounter for closed fracture: Secondary | ICD-10-CM | POA: Diagnosis present

## 2011-04-13 DIAGNOSIS — I498 Other specified cardiac arrhythmias: Secondary | ICD-10-CM | POA: Diagnosis present

## 2011-04-13 DIAGNOSIS — F0390 Unspecified dementia without behavioral disturbance: Secondary | ICD-10-CM | POA: Diagnosis present

## 2011-04-13 DIAGNOSIS — F1120 Opioid dependence, uncomplicated: Secondary | ICD-10-CM | POA: Diagnosis present

## 2011-04-13 DIAGNOSIS — M549 Dorsalgia, unspecified: Secondary | ICD-10-CM

## 2011-04-13 HISTORY — DX: Deep phlebothrombosis in pregnancy, unspecified trimester: O22.30

## 2011-04-13 HISTORY — DX: Dizziness and giddiness: R42

## 2011-04-13 HISTORY — DX: Essential (primary) hypertension: I10

## 2011-04-13 HISTORY — DX: Gastrointestinal hemorrhage, unspecified: K92.2

## 2011-04-13 HISTORY — DX: Unspecified dementia, unspecified severity, without behavioral disturbance, psychotic disturbance, mood disturbance, and anxiety: F03.90

## 2011-04-13 LAB — URINALYSIS, ROUTINE W REFLEX MICROSCOPIC
Bilirubin Urine: NEGATIVE
Ketones, ur: NEGATIVE mg/dL
Specific Gravity, Urine: 1.011 (ref 1.005–1.030)
pH: 7.5 (ref 5.0–8.0)

## 2011-04-13 LAB — COMPREHENSIVE METABOLIC PANEL
Alkaline Phosphatase: 84 U/L (ref 39–117)
BUN: 18 mg/dL (ref 6–23)
Chloride: 104 mEq/L (ref 96–112)
Creatinine, Ser: 0.84 mg/dL (ref 0.50–1.10)
GFR calc Af Amer: 68 mL/min — ABNORMAL LOW (ref >90)
GFR calc non Af Amer: 59 mL/min — ABNORMAL LOW (ref >90)
Glucose, Bld: 81 mg/dL (ref 70–99)
Potassium: 3.9 mEq/L (ref 3.5–5.1)
Total Bilirubin: 0.9 mg/dL (ref 0.3–1.2)

## 2011-04-13 LAB — DIFFERENTIAL
Basophils Absolute: 0 10*3/uL (ref 0.0–0.1)
Eosinophils Relative: 0 % (ref 0–5)
Lymphocytes Relative: 4 % — ABNORMAL LOW (ref 12–46)
Neutro Abs: 4.9 10*3/uL (ref 1.7–7.7)
Neutrophils Relative %: 94 % — ABNORMAL HIGH (ref 43–77)

## 2011-04-13 LAB — CBC
MCV: 96.8 fL (ref 78.0–100.0)
Platelets: 110 10*3/uL — ABNORMAL LOW (ref 150–400)
RBC: 3.76 MIL/uL — ABNORMAL LOW (ref 3.87–5.11)
RDW: 13.2 % (ref 11.5–15.5)
WBC: 5.2 10*3/uL (ref 4.0–10.5)

## 2011-04-13 LAB — PROTIME-INR
INR: 2.65 — ABNORMAL HIGH (ref 0.00–1.49)
Prothrombin Time: 28.7 seconds — ABNORMAL HIGH (ref 11.6–15.2)

## 2011-04-13 LAB — URINE MICROSCOPIC-ADD ON

## 2011-04-13 MED ORDER — DONEPEZIL HCL 10 MG PO TABS
10.0000 mg | ORAL_TABLET | Freq: Every day | ORAL | Status: DC
  Administered 2011-04-14: 10 mg via ORAL
  Filled 2011-04-13 (×2): qty 1

## 2011-04-13 MED ORDER — ROSUVASTATIN CALCIUM 20 MG PO TABS
20.0000 mg | ORAL_TABLET | Freq: Every day | ORAL | Status: DC
  Administered 2011-04-14 – 2011-04-18 (×5): 20 mg via ORAL
  Filled 2011-04-13 (×5): qty 1

## 2011-04-13 MED ORDER — ONDANSETRON HCL 4 MG/2ML IJ SOLN: 4.0000 mg | Freq: Four times a day (QID) | INTRAMUSCULAR | Status: DC | PRN

## 2011-04-13 MED ORDER — ACETAMINOPHEN 325 MG PO TABS
650.0000 mg | ORAL_TABLET | Freq: Four times a day (QID) | ORAL | Status: DC | PRN
  Administered 2011-04-17: 650 mg via ORAL
  Filled 2011-04-13: qty 2

## 2011-04-13 MED ORDER — SODIUM CHLORIDE 0.9 % IV BOLUS (SEPSIS)
1000.0000 mL | Freq: Once | INTRAVENOUS | Status: AC
  Administered 2011-04-13: 1000 mL via INTRAVENOUS

## 2011-04-13 MED ORDER — DOCUSATE SODIUM 100 MG PO CAPS
100.0000 mg | ORAL_CAPSULE | Freq: Two times a day (BID) | ORAL | Status: DC
  Administered 2011-04-14 – 2011-04-18 (×9): 100 mg via ORAL
  Filled 2011-04-13 (×11): qty 1

## 2011-04-13 MED ORDER — LEVOTHYROXINE SODIUM 25 MCG PO TABS
25.0000 ug | ORAL_TABLET | ORAL | Status: DC
  Administered 2011-04-14 – 2011-04-18 (×5): 25 ug via ORAL
  Filled 2011-04-13 (×6): qty 1

## 2011-04-13 MED ORDER — POLYETHYLENE GLYCOL 3350 17 G PO PACK
17.0000 g | PACK | Freq: Every day | ORAL | Status: DC | PRN
  Filled 2011-04-13: qty 1

## 2011-04-13 MED ORDER — ACETAMINOPHEN-CODEINE #3 300-30 MG PO TABS: 1.0000 | ORAL_TABLET | Freq: Four times a day (QID) | ORAL | Status: DC | PRN

## 2011-04-13 MED ORDER — ONDANSETRON HCL 4 MG PO TABS: 4.0000 mg | ORAL_TABLET | Freq: Four times a day (QID) | ORAL | Status: DC | PRN

## 2011-04-13 MED ORDER — ACETAMINOPHEN 500 MG PO TABS
500.0000 mg | ORAL_TABLET | Freq: Three times a day (TID) | ORAL | Status: DC
  Administered 2011-04-14 – 2011-04-18 (×13): 500 mg via ORAL
  Filled 2011-04-13 (×16): qty 1

## 2011-04-13 MED ORDER — SERTRALINE HCL 50 MG PO TABS
50.0000 mg | ORAL_TABLET | Freq: Every day | ORAL | Status: DC
  Administered 2011-04-14 – 2011-04-18 (×5): 50 mg via ORAL
  Filled 2011-04-13 (×5): qty 1

## 2011-04-13 MED ORDER — ENSURE PO LIQD
1.0000 | Freq: Every day | ORAL | Status: DC
  Administered 2011-04-14 – 2011-04-15 (×2): 1 via ORAL
  Administered 2011-04-16: 10:00:00 via ORAL
  Administered 2011-04-17 – 2011-04-18 (×2): 1 via ORAL
  Filled 2011-04-13 (×6): qty 237

## 2011-04-13 MED ORDER — ACETAMINOPHEN 650 MG RE SUPP: 650.0000 mg | Freq: Four times a day (QID) | RECTAL | Status: DC | PRN

## 2011-04-13 MED ORDER — OXYCODONE HCL 10 MG PO TB12: 10.0000 mg | ORAL_TABLET | Freq: Two times a day (BID) | ORAL | Status: DC

## 2011-04-13 MED ORDER — DEXTROSE 5 % IV SOLN
1.0000 g | INTRAVENOUS | Status: DC
  Administered 2011-04-14 – 2011-04-17 (×4): 1 g via INTRAVENOUS
  Filled 2011-04-13 (×5): qty 10

## 2011-04-13 MED ORDER — SODIUM CHLORIDE 0.9 % IV SOLN
INTRAVENOUS | Status: DC
  Administered 2011-04-14: 02:00:00 via INTRAVENOUS

## 2011-04-13 MED ORDER — DEXTROSE 5 % IV SOLN
1.0000 g | Freq: Once | INTRAVENOUS | Status: AC
  Administered 2011-04-13: 1 g via INTRAVENOUS
  Filled 2011-04-13: qty 10

## 2011-04-13 NOTE — ED Notes (Signed)
Attempt to call report x 2, RN unable.

## 2011-04-13 NOTE — ED Notes (Signed)
Attempt to call report x 1, RN unable.  To call back in 5-10 minutes.

## 2011-04-13 NOTE — ED Provider Notes (Signed)
History     CSN: [id number] Arrival date & time: 04/13/2011  5:02 PM   First MD Initiated Contact with Patient 04/13/11 1735      Chief Complaint  Patient presents with  . Weakness    (Consider location/radiation/quality/duration/timing/severity/associated sxs/prior treatment) Patient is a 77 y.o. female presenting with weakness. The history is provided by the patient. History Limited By: dementia.  Weakness  Additional symptoms include weakness.  Patient began having chills today.  Patient has weakness.  Denies chest pain, cough, abdominal pain, nausea, vomiting, or diarrhea.  Patient with urinary incontinence here.  Noted at triage have a temperature of 101.9  Past Medical History  Diagnosis Date  . Dementia   . Hypertension   . GI bleed   . DVT (deep vein thrombosis) in pregnancy   . Dizziness     History reviewed. No pertinent past surgical history.  No family history on file.  History  Substance Use Topics  . Smoking status: Not on file  . Smokeless tobacco: Not on file  . Alcohol Use:     OB History    Grav Para Term Preterm Abortions TAB SAB Ect Mult Living                  Review of Systems  Neurological: Positive for weakness.  All other systems reviewed and are negative.    Allergies  Review of patient's allergies indicates no known allergies.  Home Medications   Current Outpatient Rx  Name Route Sig Dispense Refill  . ACETAMINOPHEN 500 MG PO TABS Oral Take 500 mg by mouth 3 (three) times daily.      . ACETAMINOPHEN-CODEINE #3 300-30 MG PO TABS Oral Take 1 tablet by mouth every 6 (six) hours as needed. For pain     . ALENDRONATE SODIUM 70 MG PO TABS Oral Take 70 mg by mouth every 7 (seven) days. Take with a full glass of water on an empty stomach.     Marland Kitchen CALCIUM CARBONATE-VITAMIN D 500-200 MG-UNIT PO TABS Oral Take 1 tablet by mouth daily.      Marland Kitchen DOCUSATE SODIUM 100 MG PO CAPS Oral Take 100 mg by mouth 2 (two) times daily.      . DONEPEZIL HCL  10 MG PO TABS Oral Take 10 mg by mouth at bedtime.      . ENSURE PO LIQD Oral Take 1 Can by mouth daily.      Marland Kitchen LEVOTHYROXINE SODIUM 25 MCG PO TABS Oral Take 25 mcg by mouth every morning.      . OXYCONTIN PO Oral Take 10 mg by mouth 2 (two) times daily.      Marland Kitchen POLYETHYLENE GLYCOL 3350 PO PACK Oral Take 17 g by mouth daily as needed. For constipation     . ROSUVASTATIN CALCIUM 20 MG PO TABS Oral Take 20 mg by mouth daily.      . SERTRALINE HCL 50 MG PO TABS Oral Take 50 mg by mouth daily.      . WARFARIN SODIUM 4 MG PO TABS Oral Take 4 mg by mouth every other day.      . WARFARIN SODIUM 5 MG PO TABS Oral Take 5 mg by mouth every other day.        BP 172/81  Pulse 71  Temp(Src) 101.9 F (38.8 C) (Rectal)  Resp 24  SpO2 91%  Physical Exam  Nursing note and vitals reviewed. Constitutional: She is oriented to person, place, and time. She appears well-developed and  well-nourished.  HENT:  Head: Normocephalic and atraumatic.  Right Ear: External ear normal.  Left Ear: External ear normal.  Nose: Nose normal.  Eyes: Conjunctivae and EOM are normal. Pupils are equal, round, and reactive to light.  Neck: Normal range of motion. Neck supple.  Cardiovascular: Normal rate and normal pulses.   Murmur heard.  Systolic murmur is present with a grade of 5/6  Pulmonary/Chest: Effort normal.       Rhonchi right base   Abdominal: Soft. Bowel sounds are normal. She exhibits distension.  Musculoskeletal: Normal range of motion.  Neurological: She is alert and oriented to person, place, and time. She has normal reflexes.  Skin:       Erythema on back ? Pressure from lying on back.not well delineated  Psychiatric: She has a normal mood and affect. Her behavior is normal. Judgment and thought content normal.    ED Course  Procedures (including critical care time)  Labs Reviewed  COMPREHENSIVE METABOLIC PANEL - Abnormal; Notable for the following:    GFR calc non Af Amer 59 (*)    GFR calc Af  Amer 68 (*)    All other components within normal limits  CBC - Abnormal; Notable for the following:    RBC 3.76 (*)    Platelets 110 (*) PLATELET COUNT CONFIRMED BY SMEAR   All other components within normal limits  DIFFERENTIAL - Abnormal; Notable for the following:    Neutrophils Relative 94 (*)    Lymphocytes Relative 4 (*)    Lymphs Abs 0.2 (*)    Monocytes Relative 1 (*)    All other components within normal limits  PROTIME-INR - Abnormal; Notable for the following:    Prothrombin Time 28.7 (*)    INR 2.65 (*)    All other components within normal limits  CULTURE, BLOOD (ROUTINE X 2)  CULTURE, BLOOD (ROUTINE X 2)  URINALYSIS, ROUTINE W REFLEX MICROSCOPIC  POCT OCCULT BLOOD STOOL, DEVICE   No results found.   No diagnosis found.    MDM    Results for orders placed during the hospital encounter of 04/13/11  URINALYSIS, ROUTINE W REFLEX MICROSCOPIC      Component Value Range   Color, Urine YELLOW  YELLOW    Appearance CLOUDY (*) CLEAR    Specific Gravity, Urine 1.011  1.005 - 1.030    pH 7.5  5.0 - 8.0    Glucose, UA NEGATIVE  NEGATIVE (mg/dL)   Hgb urine dipstick SMALL (*) NEGATIVE    Bilirubin Urine NEGATIVE  NEGATIVE    Ketones, ur NEGATIVE  NEGATIVE (mg/dL)   Protein, ur 30 (*) NEGATIVE (mg/dL)   Urobilinogen, UA 0.2  0.0 - 1.0 (mg/dL)   Nitrite POSITIVE (*) NEGATIVE    Leukocytes, UA TRACE (*) NEGATIVE   COMPREHENSIVE METABOLIC PANEL      Component Value Range   Sodium 136  135 - 145 (mEq/L)   Potassium 3.9  3.5 - 5.1 (mEq/L)   Chloride 104  96 - 112 (mEq/L)   CO2 23  19 - 32 (mEq/L)   Glucose, Bld 81  70 - 99 (mg/dL)   BUN 18  6 - 23 (mg/dL)   Creatinine, Ser 1.47  0.50 - 1.10 (mg/dL)   Calcium 9.5  8.4 - 82.9 (mg/dL)   Total Protein 6.3  6.0 - 8.3 (g/dL)   Albumin 3.5  3.5 - 5.2 (g/dL)   AST 21  0 - 37 (U/L)   ALT 12  0 - 35 (  U/L)   Alkaline Phosphatase 84  39 - 117 (U/L)   Total Bilirubin 0.9  0.3 - 1.2 (mg/dL)   GFR calc non Af Amer 59 (*) >90  (mL/min)   GFR calc Af Amer 68 (*) >90 (mL/min)  CBC      Component Value Range   WBC 5.2  4.0 - 10.5 (K/uL)   RBC 3.76 (*) 3.87 - 5.11 (MIL/uL)   Hemoglobin 12.4  12.0 - 15.0 (g/dL)   HCT 45.4  09.8 - 11.9 (%)   MCV 96.8  78.0 - 100.0 (fL)   MCH 33.0  26.0 - 34.0 (pg)   MCHC 34.1  30.0 - 36.0 (g/dL)   RDW 14.7  82.9 - 56.2 (%)   Platelets 110 (*) 150 - 400 (K/uL)  DIFFERENTIAL      Component Value Range   Neutrophils Relative 94 (*) 43 - 77 (%)   Neutro Abs 4.9  1.7 - 7.7 (K/uL)   Lymphocytes Relative 4 (*) 12 - 46 (%)   Lymphs Abs 0.2 (*) 0.7 - 4.0 (K/uL)   Monocytes Relative 1 (*) 3 - 12 (%)   Monocytes Absolute 0.1  0.1 - 1.0 (K/uL)   Eosinophils Relative 0  0 - 5 (%)   Eosinophils Absolute 0.0  0.0 - 0.7 (K/uL)   Basophils Relative 0  0 - 1 (%)   Basophils Absolute 0.0  0.0 - 0.1 (K/uL)  PROTIME-INR      Component Value Range   Prothrombin Time 28.7 (*) 11.6 - 15.2 (seconds)   INR 2.65 (*) 0.00 - 1.49   URINE MICROSCOPIC-ADD ON      Component Value Range   Squamous Epithelial / LPF MANY (*) RARE    WBC, UA 7-10  <3 (WBC/hpf)   RBC / HPF 3-6  <3 (RBC/hpf)   Bacteria, UA MANY (*) RARE     Dg Chest Port 1 View  04/13/2011  *RADIOLOGY REPORT*  Clinical Data: Fever.  PORTABLE CHEST - 1 VIEW  Comparison: 07/01/2010.  Findings: The heart is enlarged.  Chronic emphysematous changes are again noted.  No focal airspace disease is present.  There is no edema or effusion to suggest failure.  The visualized soft tissues and bony thorax are unremarkable.  IMPRESSION:  1.  No acute cardiopulmonary disease or significant interval change. 2.  Stable cardiomegaly without failure. 3.  Emphysema.  Original Report Authenticated By: Jamesetta Orleans. MATTERN, M.D.   Patient's white blood cell count is 5200 with 94% neutrophils. Chemistry is normal. Chest x-Meisha Salone did not show any evidence of acute infiltrate. Patient received orders at triage and had blood cultures drawn. Urine shows some 10 white  blood cells 3-6 red blood cells and many bacteria. This has been sent for culture. She was given 1 g Rocephin here. Heart rate is in the 70s blood pressure has been elevated. Respiratory rate has ranged from 16-24. Possible CircAid for admission for urinary tract infection. Patient had distended abdomen and when Foley catheter was placed had 1000 cc out initially. . I discussed patient's care with Dr. Sheldon Silvan. She'll be admitted to a telemetry bed. I discussed end-of-life issues the patient and she states that she does not want to have CPR or be placed on a ventilator. She is made DO NOT RESUSCITATE appear  Hilario Quarry, MD 04/13/11 2138

## 2011-04-13 NOTE — ED Provider Notes (Signed)
History     CSN: [id number] Arrival date & time: 04/13/2011  5:02 PM   First MD Initiated Contact with Patient 04/13/11 1735      Chief Complaint  Patient presents with  . Weakness    (Consider location/radiation/quality/duration/timing/severity/associated sxs/prior treatment) HPI  Past Medical History  Diagnosis Date  . Dementia   . Hypertension   . GI bleed   . DVT (deep vein thrombosis) in pregnancy   . Dizziness     History reviewed. No pertinent past surgical history.  No family history on file.  History  Substance Use Topics  . Smoking status: Not on file  . Smokeless tobacco: Not on file  . Alcohol Use:     OB History    Grav Para Term Preterm Abortions TAB SAB Ect Mult Living                  Review of Systems  Allergies  Review of patient's allergies indicates no known allergies.  Home Medications   Current Outpatient Rx  Name Route Sig Dispense Refill  . ACETAMINOPHEN 500 MG PO TABS Oral Take 500 mg by mouth 3 (three) times daily.      . ACETAMINOPHEN-CODEINE #3 300-30 MG PO TABS Oral Take 1 tablet by mouth every 6 (six) hours as needed. For pain     . ALENDRONATE SODIUM 70 MG PO TABS Oral Take 70 mg by mouth every 7 (seven) days. Take with a full glass of water on an empty stomach.     Marland Kitchen CALCIUM CARBONATE-VITAMIN D 500-200 MG-UNIT PO TABS Oral Take 1 tablet by mouth daily.      Marland Kitchen DOCUSATE SODIUM 100 MG PO CAPS Oral Take 100 mg by mouth 2 (two) times daily.      . DONEPEZIL HCL 10 MG PO TABS Oral Take 10 mg by mouth at bedtime.      . ENSURE PO LIQD Oral Take 1 Can by mouth daily.      Marland Kitchen LEVOTHYROXINE SODIUM 25 MCG PO TABS Oral Take 25 mcg by mouth every morning.      . OXYCONTIN PO Oral Take 10 mg by mouth 2 (two) times daily.      Marland Kitchen POLYETHYLENE GLYCOL 3350 PO PACK Oral Take 17 g by mouth daily as needed. For constipation     . ROSUVASTATIN CALCIUM 20 MG PO TABS Oral Take 20 mg by mouth daily.      . SERTRALINE HCL 50 MG PO TABS Oral Take 50  mg by mouth daily.      . WARFARIN SODIUM 4 MG PO TABS Oral Take 4 mg by mouth every other day.      . WARFARIN SODIUM 5 MG PO TABS Oral Take 5 mg by mouth every other day.        BP 172/81  Pulse 71  Temp(Src) 94.4 F (34.7 C) (Oral)  Resp 24  SpO2 91%  Physical Exam  ED Course  Procedures (including critical care time)  Labs Reviewed - No data to display No results found.   No diagnosis found.    MDM  Medical screening exam. History of dementia patient had tremors immediately prior to coming here she is asymptomatic now except for bilateral foot pain. On exam alert awake noted to be febrile 101.4 rectally lungs clear auscultation heart regular rate and rhythm abdomen nondistended nontender rectum normal tone brown stool no gross blood extremities without edema neurovascularly intact        Korayma Hagwood,  MD 04/13/11 1827

## 2011-04-13 NOTE — Progress Notes (Signed)
ANTICOAGULATION CONSULT NOTE - Initial Consult  Pharmacy Consult for Coumadin Indication: h/o DVT  Allergies  Allergen Reactions  . Penicillins     Unknown reaction.    Patient Measurements:     Vital Signs: Temp: 100.2 F (37.9 C) (11/14 2300) Temp src: Core (Comment) (11/14 2146) BP: 147/78 mmHg (11/14 2300) Pulse Rate: 70  (11/14 2300)  Labs:  [facility] 04/13/11 1829  HGB 12.4  HCT 36.4  PLT 110*  APTT --  LABPROT 28.7*  INR 2.65*  HEPARINUNFRC --  CREATININE 0.84  CKTOTAL --  CKMB --  TROPONINI --   CrCl is unknown because there is no height on file for the current visit.  Medical History: Past Medical History  Diagnosis Date  . Dementia   . Hypertension   . GI bleed   . DVT (deep vein thrombosis) in pregnancy   . Dizziness     Medications:   (Not in a hospital admission)  Assessment: 77 y/o female patient admitted with weakness/UTI, on chronic coumadin for h/o DVT. INR therapeutic. Noted thrombocytopenia. Goal of Therapy:  INR 2-3   Plan:  F/u daily protime.  Leodis Binet, Kaidance Pantoja M 04/13/2011,11:14 PM

## 2011-04-13 NOTE — ED Notes (Signed)
Pt from [facility].  States that she fell today because she just got dizzy.  Denies LOC, denies hitting her head.  Reports that her lower back is hurting.  Pt very anxious, tearful, tremorous.  States that her husband died a couple of weeks ago and she misses him.  Pt reassured.  Pt taken off the bed pan and cleaned up by NT and RN.  No distress noted.  Pt resting, call light given.

## 2011-04-13 NOTE — H&P (Signed)
Rebecca Dawson is an 77 y.o. female.   Chief Complaint: Fever and chills HPI: 77 year old female with H/O DVT on coumadin, dementia chronic pain was brought to ER because of fever and chills for one day. Patient in the ER was found to be febrile with temp of 101. Urine has features of UTI. In addition patient has urinary retention for which foley catheter was placed. Pateint has been complaining of back pain and is directing her hands to flanks. Denies any chest pain or shortness of breath or cough. Denies anu nausea or vomiting or diarrhea.  Past Medical History  Diagnosis Date  . Dementia   . Hypertension   . GI bleed   . DVT (deep vein thrombosis) in pregnancy   . Dizziness     Past Surgical History  Procedure Date  . Abdominal hysterectomy     Family History  Problem Relation Age of Onset  . Hypertension Other    Social History:  reports that she has never smoked. She does not have any smokeless tobacco history on file. Her alcohol and drug histories not on file.  Allergies:  Allergies  Allergen Reactions  . Penicillins     Unknown reaction.    Medications Prior to Admission  Medication Dose Route Frequency Provider Last Rate Last Dose  . 0.9 %  sodium chloride infusion   Intravenous Continuous Eduard Clos      . acetaminophen (TYLENOL) tablet 650 mg  650 mg Oral Q6H PRN Eduard Clos       Or  . acetaminophen (TYLENOL) suppository 650 mg  650 mg Rectal Q6H PRN Eduard Clos      . acetaminophen (TYLENOL) tablet 500 mg  500 mg Oral TID Eduard Clos      . acetaminophen-codeine (TYLENOL #3) 300-30 MG per tablet 1 tablet  1 tablet Oral Q6H PRN Eduard Clos      . cefTRIAXone (ROCEPHIN) 1 g in dextrose 5 % 50 mL IVPB  1 g Intravenous Once Hilario Quarry, MD   1 g at 04/13/11 2049  . cefTRIAXone (ROCEPHIN) 1 g in dextrose 5 % 50 mL IVPB  1 g Intravenous Q24H Eduard Clos      . docusate sodium (COLACE) capsule 100 mg  100 mg  Oral BID Eduard Clos      . donepezil (ARICEPT) tablet 10 mg  10 mg Oral QHS Eduard Clos      . ENSURE (ENSURE) liquid 1 Can  1 Can Oral Daily Eduard Clos      . levothyroxine (SYNTHROID, LEVOTHROID) tablet 25 mcg  25 mcg Oral QAM Eduard Clos      . ondansetron (ZOFRAN) tablet 4 mg  4 mg Oral Q6H PRN Eduard Clos       Or  . ondansetron (ZOFRAN) injection 4 mg  4 mg Intravenous Q6H PRN Eduard Clos      . oxyCODONE (OXYCONTIN) 12 hr tablet 10 mg  10 mg Oral Q12H Eduard Clos      . polyethylene glycol (MIRALAX / GLYCOLAX) packet 17 g  17 g Oral Daily PRN Eduard Clos      . rosuvastatin (CRESTOR) tablet 20 mg  20 mg Oral Daily Eduard Clos      . sertraline (ZOLOFT) tablet 50 mg  50 mg Oral Daily Eduard Clos      . sodium chloride 0.9 % bolus 1,000 mL  1,000  mL Intravenous Once Doug Sou, MD   1,000 mL at 04/13/11 1830   No current outpatient prescriptions on file as of 04/13/2011.    Results for orders placed during the hospital encounter of 04/13/11 (from the past 48 hour(s))  COMPREHENSIVE METABOLIC PANEL     Status: Abnormal   Collection Time   04/13/11  6:29 PM      Component Value Range Comment   Sodium 136  135 - 145 (mEq/L)    Potassium 3.9  3.5 - 5.1 (mEq/L)    Chloride 104  96 - 112 (mEq/L)    CO2 23  19 - 32 (mEq/L)    Glucose, Bld 81  70 - 99 (mg/dL)    BUN 18  6 - 23 (mg/dL)    Creatinine, Ser 1.61  0.50 - 1.10 (mg/dL)    Calcium 9.5  8.4 - 10.5 (mg/dL)    Total Protein 6.3  6.0 - 8.3 (g/dL)    Albumin 3.5  3.5 - 5.2 (g/dL)    AST 21  0 - 37 (U/L)    ALT 12  0 - 35 (U/L)    Alkaline Phosphatase 84  39 - 117 (U/L)    Total Bilirubin 0.9  0.3 - 1.2 (mg/dL)    GFR calc non Af Amer 59 (*) >90 (mL/min)    GFR calc Af Amer 68 (*) >90 (mL/min)   CBC     Status: Abnormal   Collection Time   04/13/11  6:29 PM      Component Value Range Comment   WBC 5.2  4.0 - 10.5 (K/uL)    RBC  3.76 (*) 3.87 - 5.11 (MIL/uL)    Hemoglobin 12.4  12.0 - 15.0 (g/dL)    HCT 09.6  04.5 - 40.9 (%)    MCV 96.8  78.0 - 100.0 (fL)    MCH 33.0  26.0 - 34.0 (pg)    MCHC 34.1  30.0 - 36.0 (g/dL)    RDW 81.1  91.4 - 78.2 (%)    Platelets 110 (*) 150 - 400 (K/uL) PLATELET COUNT CONFIRMED BY SMEAR  DIFFERENTIAL     Status: Abnormal   Collection Time   04/13/11  6:29 PM      Component Value Range Comment   Neutrophils Relative 94 (*) 43 - 77 (%)    Neutro Abs 4.9  1.7 - 7.7 (K/uL)    Lymphocytes Relative 4 (*) 12 - 46 (%)    Lymphs Abs 0.2 (*) 0.7 - 4.0 (K/uL)    Monocytes Relative 1 (*) 3 - 12 (%)    Monocytes Absolute 0.1  0.1 - 1.0 (K/uL)    Eosinophils Relative 0  0 - 5 (%)    Eosinophils Absolute 0.0  0.0 - 0.7 (K/uL)    Basophils Relative 0  0 - 1 (%)    Basophils Absolute 0.0  0.0 - 0.1 (K/uL)   PROTIME-INR     Status: Abnormal   Collection Time   04/13/11  6:29 PM      Component Value Range Comment   Prothrombin Time 28.7 (*) 11.6 - 15.2 (seconds)    INR 2.65 (*) 0.00 - 1.49    URINALYSIS, ROUTINE W REFLEX MICROSCOPIC     Status: Abnormal   Collection Time   04/13/11  7:40 PM      Component Value Range Comment   Color, Urine YELLOW  YELLOW     Appearance CLOUDY (*) CLEAR     Specific Gravity, Urine 1.011  1.005 - 1.030     pH 7.5  5.0 - 8.0     Glucose, UA NEGATIVE  NEGATIVE (mg/dL)    Hgb urine dipstick SMALL (*) NEGATIVE     Bilirubin Urine NEGATIVE  NEGATIVE     Ketones, ur NEGATIVE  NEGATIVE (mg/dL)    Protein, ur 30 (*) NEGATIVE (mg/dL)    Urobilinogen, UA 0.2  0.0 - 1.0 (mg/dL)    Nitrite POSITIVE (*) NEGATIVE     Leukocytes, UA TRACE (*) NEGATIVE    URINE MICROSCOPIC-ADD ON     Status: Abnormal   Collection Time   04/13/11  7:40 PM      Component Value Range Comment   Squamous Epithelial / LPF MANY (*) RARE     WBC, UA 7-10  <3 (WBC/hpf)    RBC / HPF 3-6  <3 (RBC/hpf)    Bacteria, UA MANY (*) RARE     Dg Chest Port 1 View  04/13/2011  *RADIOLOGY REPORT*   Clinical Data: Fever.  PORTABLE CHEST - 1 VIEW  Comparison: 07/01/2010.  Findings: The heart is enlarged.  Chronic emphysematous changes are again noted.  No focal airspace disease is present.  There is no edema or effusion to suggest failure.  The visualized soft tissues and bony thorax are unremarkable.  IMPRESSION:  1.  No acute cardiopulmonary disease or significant interval change. 2.  Stable cardiomegaly without failure. 3.  Emphysema.  Original Report Authenticated By: Jamesetta Orleans. MATTERN, M.D.    Review of Systems  Constitutional: Positive for fever.  HENT: Negative.   Eyes: Negative.   Respiratory: Negative.   Cardiovascular: Negative.   Gastrointestinal: Negative.   Genitourinary: Negative.   Musculoskeletal: Positive for back pain.  Skin: Negative.   Neurological: Negative.   Endo/Heme/Allergies: Negative.   Psychiatric/Behavioral: Negative.     Blood pressure 140/53, pulse 70, temperature 100.6 F (38.1 C), temperature source Core (Comment), resp. rate 18, SpO2 97.00%. Physical Exam  Constitutional: She is oriented to person, place, and time. She appears well-developed and well-nourished.  HENT:  Head: Normocephalic and atraumatic.  Right Ear: External ear normal.  Left Ear: External ear normal.  Nose: Nose normal.  Mouth/Throat: Oropharynx is clear and moist.  Eyes: Conjunctivae and EOM are normal.  Neck: Normal range of motion. Neck supple.  Cardiovascular: Normal rate, regular rhythm, normal heart sounds and intact distal pulses.   Respiratory: Effort normal and breath sounds normal.  GI: Soft. Bowel sounds are normal.  Musculoskeletal: Normal range of motion.  Neurological: She is alert and oriented to person, place, and time. She has normal reflexes.  Skin: Skin is warm and dry.  Psychiatric: Her behavior is normal.     Assessment/Plan 1)Fever and chills probably from UTI. 2)Urinary retention 3)Back pain 4)Coagulopathy secondary to coumadin 5)H/O  DVT 6)H/O GI bleed 7)H/O Dementia  Plan: Will admit to telemetry Continue ceftriaxone for UTI. Follow cultures both blood and urine which has been already sent from ER. Patient is complaining of back pain and has urinary retention. Reviewing her chart patient previously did have hydronephrosis. Will get CT abdomen and pelvis. Patient has h/o aortic stenosis and cardiomyopathy so will be gentle in hydration Coumadin per pharmacy.  Wynn Alldredge N. 04/13/2011, 11:01 PM

## 2011-04-13 NOTE — ED Notes (Signed)
She has had the tremors for 30 mins has been under stress no pain ? Anxiety attack

## 2011-04-13 NOTE — ED Notes (Signed)
Under a lot of stress because her husband died last week Family  Daughter in law and son Mike/ ruth  Andrey Farmer  [phone] phone the family has been trying to move her up  To them but have been unable to .

## 2011-04-14 ENCOUNTER — Inpatient Hospital Stay (HOSPITAL_COMMUNITY): Payer: Medicare Other

## 2011-04-14 ENCOUNTER — Encounter (HOSPITAL_COMMUNITY): Payer: Self-pay | Admitting: *Deleted

## 2011-04-14 DIAGNOSIS — D696 Thrombocytopenia, unspecified: Secondary | ICD-10-CM | POA: Clinically undetermined

## 2011-04-14 DIAGNOSIS — I369 Nonrheumatic tricuspid valve disorder, unspecified: Secondary | ICD-10-CM

## 2011-04-14 DIAGNOSIS — S32030A Wedge compression fracture of third lumbar vertebra, initial encounter for closed fracture: Secondary | ICD-10-CM | POA: Diagnosis present

## 2011-04-14 LAB — T4, FREE: Free T4: 0.89 ng/dL (ref 0.80–1.80)

## 2011-04-14 LAB — COMPREHENSIVE METABOLIC PANEL
ALT: 10 U/L (ref 0–35)
Alkaline Phosphatase: 61 U/L (ref 39–117)
BUN: 16 mg/dL (ref 6–23)
CO2: 24 mEq/L (ref 19–32)
Chloride: 109 mEq/L (ref 96–112)
GFR calc Af Amer: 65 mL/min — ABNORMAL LOW (ref >90)
GFR calc non Af Amer: 57 mL/min — ABNORMAL LOW (ref >90)
Glucose, Bld: 82 mg/dL (ref 70–99)
Potassium: 3.7 mEq/L (ref 3.5–5.1)
Sodium: 142 mEq/L (ref 135–145)
Total Bilirubin: 0.7 mg/dL (ref 0.3–1.2)
Total Protein: 5.2 g/dL — ABNORMAL LOW (ref 6.0–8.3)

## 2011-04-14 LAB — CBC
HCT: 34.4 % — ABNORMAL LOW (ref 36.0–46.0)
MCHC: 32.6 g/dL (ref 30.0–36.0)
Platelets: 117 10*3/uL — ABNORMAL LOW (ref 150–400)
RDW: 13.5 % (ref 11.5–15.5)
WBC: 7.3 10*3/uL (ref 4.0–10.5)

## 2011-04-14 LAB — PROTIME-INR
INR: 2.67 — ABNORMAL HIGH (ref 0.00–1.49)
Prothrombin Time: 28.9 seconds — ABNORMAL HIGH (ref 11.6–15.2)

## 2011-04-14 MED ORDER — POTASSIUM CHLORIDE CRYS ER 20 MEQ PO TBCR
40.0000 meq | EXTENDED_RELEASE_TABLET | Freq: Once | ORAL | Status: AC
  Administered 2011-04-14: 40 meq via ORAL
  Filled 2011-04-14 (×2): qty 2

## 2011-04-14 MED ORDER — SODIUM CHLORIDE 0.9 % IV SOLN: INTRAVENOUS | Status: DC

## 2011-04-14 MED ORDER — HYDROCODONE-ACETAMINOPHEN 5-325 MG PO TABS: 1.0000 | ORAL_TABLET | ORAL | Status: DC | PRN

## 2011-04-14 NOTE — Progress Notes (Signed)
ANTICOAGULATION CONSULT NOTE - Follow Up   Pharmacy Consult for Coumadin Indication: h/o DVT  Allergies  Allergen Reactions  . Penicillins     Unknown reaction.    Patient Measurements: Height: 5\' 3"  (160 cm) Weight: 126 lb 8 oz (57.38 kg) IBW/kg (Calculated) : 52.4    Vital Signs: Temp: 97.9 F (36.6 C) (11/15 0516) Temp src: Oral (11/15 0516) BP: 122/66 mmHg (11/15 0516) Pulse Rate: 56  (11/15 0516)  Labs:  Basename 04/14/11 0700 04/13/11 1829  HGB 11.2* 12.4  HCT 34.4* 36.4  PLT 117* 110*  APTT -- --  LABPROT 28.9* 28.7*  INR 2.67* 2.65*  HEPARINUNFRC -- --  CREATININE 0.87 0.84  CKTOTAL -- --  CKMB -- --  TROPONINI -- --   Estimated Creatinine Clearance: 34.8 ml/min (by C-G formula based on Cr of 0.87).  Medical History: Past Medical History  Diagnosis Date  . Dementia   . Hypertension   . GI bleed   . DVT (deep vein thrombosis) in pregnancy   . Dizziness     Medications:  Prescriptions prior to admission  Medication Sig Dispense Refill  . acetaminophen (TYLENOL) 500 MG tablet Take 500 mg by mouth 3 (three) times daily.        Marland Kitchen acetaminophen-codeine (TYLENOL #3) 300-30 MG per tablet Take 1 tablet by mouth every 6 (six) hours as needed. For pain       . alendronate (FOSAMAX) 70 MG tablet Take 70 mg by mouth every 7 (seven) days. Take with a full glass of water on an empty stomach.       . calcium-vitamin D (OSCAL WITH D) 500-200 MG-UNIT per tablet Take 1 tablet by mouth daily.        Marland Kitchen docusate sodium (COLACE) 100 MG capsule Take 100 mg by mouth 2 (two) times daily.        Marland Kitchen donepezil (ARICEPT) 10 MG tablet Take 10 mg by mouth at bedtime.        . ENSURE (ENSURE) Take 1 Can by mouth daily.        Marland Kitchen levothyroxine (SYNTHROID, LEVOTHROID) 25 MCG tablet Take 25 mcg by mouth every morning.        . OxyCODONE HCl (OXYCONTIN PO) Take 10 mg by mouth 2 (two) times daily.        . polyethylene glycol (MIRALAX / GLYCOLAX) packet Take 17 g by mouth daily as  needed. For constipation       . rosuvastatin (CRESTOR) 20 MG tablet Take 20 mg by mouth daily.        . sertraline (ZOLOFT) 50 MG tablet Take 50 mg by mouth daily.        Marland Kitchen warfarin (COUMADIN) 4 MG tablet Take 4 mg by mouth every other day.        . warfarin (COUMADIN) 5 MG tablet Take 5 mg by mouth every other day.          Assessment: 77 y/o female patient admitted with weakness/UTI, on chronic coumadin for h/o DVT. INR therapeutic. Patients home dose of coumadin is 5mg /4mg  alternating each day.  Patient took 4mg  yesterday.  Noted thrombocytopenia-plts inc to 117.    Goal of Therapy:  INR 2-3   Plan:  Coumadin 5mg  today.  Daily PT/INR.  F/u plts.  Marylouise Stacks 04/14/2011,11:22 AM Pager [phone]

## 2011-04-14 NOTE — Progress Notes (Signed)
Subjective: I feel a little better.  The heating pad really helped my back and helped me sleep.   Objective:  Vital signs in last 24 hours:  Filed Vitals:   04/13/11 2300 04/14/11 0001 04/14/11 0500 04/14/11 0516  BP: 147/78 126/53  122/66  Pulse: 70 60  56  Temp: 100.2 F (37.9 C) 98.1 F (36.7 C)  97.9 F (36.6 C)  TempSrc:  Oral  Oral  Resp:  19  21  Height:  5\' 3"  (1.6 m)    Weight:  57.516 kg (126 lb 12.8 oz) 57.38 kg (126 lb 8 oz)   SpO2: 98% 96%  98%   TELE:  Sinus Brady in the 47 - 52 range.   Intake/Output from previous day:   Intake/Output Summary (Last 24 hours) at 04/14/11 0819 Last data filed at 04/14/11 0600  Gross per 24 hour  Intake   1350 ml  Output      0 ml  Net   1350 ml    Physical Exam:  The patient is asleep but awakens easily at the sound of my voice she appears weak and frail-appearing Head is atraumatic normocephalic Heart has a regular rate and rhythm with 3/6 systolic murmur no rubs or gallops Lungs are clear to auscultation without wheezes or crackles no accessory muscle movement noted Abdomen soft nontender nondistended good bowel sounds. Extremities are warm and have no clubbing cyanosis or edema.  Psychiatric patient is alert oriented, demeanor is appropriate, she is mildly hard of hearing but does not have any confusion this morning.   Lab Results: Basic Metabolic Panel:    Component Value Date/Time   NA 142 04/14/2011 0700   K 3.7 04/14/2011 0700   CL 109 04/14/2011 0700   CO2 24 04/14/2011 0700   BUN 16 04/14/2011 0700   CREATININE 0.87 04/14/2011 0700   GLUCOSE 82 04/14/2011 0700   CALCIUM 8.2* 04/14/2011 0700   CBC:    Component Value Date/Time   WBC 7.3 04/14/2011 0700   HGB 11.2* 04/14/2011 0700   HCT 34.4* 04/14/2011 0700   PLT 117* 04/14/2011 0700   MCV 97.7 04/14/2011 0700   NEUTROABS 4.9 04/13/2011 1829   LYMPHSABS 0.2* 04/13/2011 1829   MONOABS 0.1 04/13/2011 1829   EOSABS 0.0 04/13/2011 1829   BASOSABS  0.0 04/13/2011 1829    Recent Results (from the past 240 hour(s))  MRSA PCR SCREENING     Status: Normal   Collection Time   04/14/11 12:44 AM      Component Value Range Status Comment   MRSA by PCR NEGATIVE  NEGATIVE  Final     Studies/Results: Ct Abdomen Pelvis Wo Contrast  04/14/2011  *RADIOLOGY REPORT*  Clinical Data: 77 year old female with back pain, urinary tract infection, fevers.  Query hydronephrosis or urologic stone.  CT ABDOMEN AND PELVIS WITHOUT CONTRAST   IMPRESSION:  1.  Mild to moderate L3 compression fracture which is new since 2009 and may be acute.  If specific therapy such as vertebroplasty is desired a nuclear medicine bone scan or MRI would best evaluate chronicity. 2.  Mild perinephric stranding greater on the left, this could be age related, related to acute renal insufficiency, or less likely renal infection.  Bilateral nephrolithiasis but no evidence of acute obstructive uropathy. 3.  No other acute findings in the abdomen or pelvis.   Dg Chest Port 1 View  04/13/2011  *RADIOLOGY REPORT*  Clinical Data: Fever.  PORTABLE CHEST - 1 VIEW  Comparison: 07/01/2010.  IMPRESSION:  1.  No acute cardiopulmonary disease or significant interval change. 2.  Stable cardiomegaly without failure. 3.  Emphysema.   Medications: Scheduled Meds:   . acetaminophen  500 mg Oral TID  . cefTRIAXone (ROCEPHIN) IV  1 g Intravenous Once  . cefTRIAXone (ROCEPHIN) IV  1 g Intravenous Q24H  . docusate sodium  100 mg Oral BID  . donepezil  10 mg Oral QHS  . ENSURE  1 Can Oral Daily  . levothyroxine  25 mcg Oral QAM  . oxyCODONE  10 mg Oral Q12H  . rosuvastatin  20 mg Oral Daily  . sertraline  50 mg Oral Daily  . sodium chloride  1,000 mL Intravenous Once   Continuous Infusions:   . sodium chloride 75 mL/hr at 04/14/11 0200   PRN Meds:.acetaminophen, acetaminophen, acetaminophen-codeine, ondansetron (ZOFRAN) IV, ondansetron, polyethylene glycol  Assessment/Plan:  Principal  Problem:  *Fever - Tmax 100.1 last night.  Afebrile this morning.  Active Problems:  UTI (lower urinary tract infection) - On Rocephin, Cultures Pending.  CT shows some perinephritic stranding.   Back pain - helped with heating pad.  Has pain meds ordered.   Compression Fracture in Lumbar Spine (L3) - Acute v Older? Uncertain if vertebralplasty would help.  (will discuss with attending).  Narcotic Dependence:  Patient appears to by on Oxycontin 10 bid Scheduled at [employer].  As she is brady cardic and not complaining of pain I am decreasing her dose to hydrocodone 5 q 4 hours PRN Mod pain.  I do not want to worsen her bradycardia & I do not want to cause narcotic withdraw.  Will discuss with attending.   Hypothyroidism - stable on synthroid.   Dementia - stable (also hard of hearing)   DVT (deep venous thrombosis) - on coumadin.    Emphysema - stable   Thrombocytopenia - will monitor.  Sinus Brady on Tele (47 - 52) am uncertain if she is symptomatic from this.  Patient has a systolic murmur - likely aortic stenosis.   Will check a Tsh.  May consider a 2 D echo if this has not been done in the last several years.  Will continue current care and ask PT/OT to evaluate.    LOS: 1 day   YORK,MARIANNE L 04/14/2011, 8:19 AM  Addendum  Patient seen and examined, chart and data base reviewed.  I agree with the above assessment and plan  For full details please see Mrs. Algis Downs PA. Note.   For patient's bradycardia I will discontinue the Aricept and monitor on telemetry if she still have bradycardia, I might obtain 2-D echocardiogram.  Donnesha Karg A, 04/14/2011, 2:00 PM

## 2011-04-15 ENCOUNTER — Other Ambulatory Visit: Payer: Self-pay

## 2011-04-15 DIAGNOSIS — R001 Bradycardia, unspecified: Secondary | ICD-10-CM | POA: Diagnosis present

## 2011-04-15 DIAGNOSIS — I498 Other specified cardiac arrhythmias: Secondary | ICD-10-CM

## 2011-04-15 DIAGNOSIS — N390 Urinary tract infection, site not specified: Principal | ICD-10-CM

## 2011-04-15 LAB — CBC
HCT: 35.6 % — ABNORMAL LOW (ref 36.0–46.0)
MCHC: 32.9 g/dL (ref 30.0–36.0)
MCV: 97.5 fL (ref 78.0–100.0)
Platelets: 112 10*3/uL — ABNORMAL LOW (ref 150–400)
RDW: 13.5 % (ref 11.5–15.5)
WBC: 5.5 10*3/uL (ref 4.0–10.5)

## 2011-04-15 LAB — URINE CULTURE

## 2011-04-15 LAB — PROTIME-INR: INR: 2.12 — ABNORMAL HIGH (ref 0.00–1.49)

## 2011-04-15 MED ORDER — WARFARIN SODIUM 4 MG PO TABS
4.0000 mg | ORAL_TABLET | Freq: Once | ORAL | Status: AC
  Administered 2011-04-15: 4 mg via ORAL
  Filled 2011-04-15: qty 1

## 2011-04-15 MED ORDER — HYDRALAZINE HCL 20 MG/ML IJ SOLN
5.0000 mg | Freq: Four times a day (QID) | INTRAMUSCULAR | Status: DC | PRN
  Administered 2011-04-15: 5 mg via INTRAVENOUS
  Filled 2011-04-15: qty 0.25

## 2011-04-15 NOTE — Progress Notes (Signed)
Subjective: Patient Crying.  I'm Cold. I don't feel right.  I just want to be with my husband. (Husband died approx 3 weeks ago).  Objective:  Tele: Sinus Brady.  Sustained 37 - 50.  Vital signs in last 24 hours:  Filed Vitals:   04/14/11 1900 04/15/11 0536 04/15/11 0600 04/15/11 0645  BP: 152/72 178/74 186/80 174/76  Pulse: 47 52 48 52  Temp: 97.8 F (36.6 C) 98.2 F (36.8 C)    TempSrc: Oral Oral    Resp: 21 20    Height:      Weight:      SpO2: 96% 94%      Intake/Output from previous day:   Intake/Output Summary (Last 24 hours) at 04/15/11 1127 Last data filed at 04/15/11 0855  Gross per 24 hour  Intake 1492.5 ml  Output   2702 ml  Net -1209.5 ml    Physical Exam: The patient is an elderly frail female lying in bed crying Chest: is clear to auscultation bilaterally without wheezes crackles or rales Heart: sounds bradycardic with systolic murmur likely aortic stenosis. No gallops. Abdomen: thin soft nontender nondistended with bowel sounds Extremities: show no edema she is able to move all 4 extremities. Psychiatric: The patient is crying, it is difficult to keep her on topic. She appears acutely severely depressed  Lab Results:  Basic Metabolic Panel:    Component Value Date/Time   NA 142 04/14/2011 0700   K 3.7 04/14/2011 0700   CL 109 04/14/2011 0700   CO2 24 04/14/2011 0700   BUN 16 04/14/2011 0700   CREATININE 0.87 04/14/2011 0700   GLUCOSE 82 04/14/2011 0700   CALCIUM 8.2* 04/14/2011 0700   CBC:    Component Value Date/Time   WBC 5.5 04/15/2011 0620   HGB 11.7* 04/15/2011 0620   HCT 35.6* 04/15/2011 0620   PLT 112* 04/15/2011 0620   MCV 97.5 04/15/2011 0620   NEUTROABS 4.9 04/13/2011 1829   LYMPHSABS 0.2* 04/13/2011 1829   MONOABS 0.1 04/13/2011 1829   EOSABS 0.0 04/13/2011 1829   BASOSABS 0.0 04/13/2011 1829    Recent Results (from the past 240 hour(s))  CULTURE, BLOOD (ROUTINE X 2)     Status: Normal (Preliminary result)   Collection  Time   04/13/11  6:28 PM      Component Value Range Status Comment   Specimen Description BLOOD ARM RIGHT   Final    Special Requests BOTTLES DRAWN AEROBIC AND ANAEROBIC 10CC   Final    Setup Time [id number]   Final    Culture     Final    Value: GRAM NEGATIVE RODS     Note: Gram Stain Report Called to,Read Back By and Verified With: Lysbeth Galas @ 1125 04/15/11 WICKN   Report Status PENDING   Incomplete   CULTURE, BLOOD (ROUTINE X 2)     Status: Normal (Preliminary result)   Collection Time   04/13/11  6:38 PM      Component Value Range Status Comment   Specimen Description BLOOD HAND RIGHT   Final    Special Requests BOTTLES DRAWN AEROBIC ONLY 10CC   Final    Setup Time [id number]   Final    Culture     Final    Value:        BLOOD CULTURE RECEIVED NO GROWTH TO DATE CULTURE WILL BE HELD FOR 5 DAYS BEFORE ISSUING A FINAL NEGATIVE REPORT   Report Status PENDING   Incomplete   MRSA PCR  SCREENING     Status: Normal   Collection Time   04/14/11 12:44 AM      Component Value Range Status Comment   MRSA by PCR NEGATIVE  NEGATIVE  Final     Studies/Results: Ct Abdomen Pelvis Wo Contrast  04/14/2011  *RADIOLOGY REPORT*  Clinical Data: 76 year old female with back pain, urinary tract infection, fevers.  Query hydronephrosis or urologic stone.  CT ABDOMEN AND PELVIS WITHOUT CONTRAST  Technique:  Multidetector CT imaging of the abdomen and pelvis was performed following the standard protocol without intravenous contrast.  Comparison: 03/05/2008.  Findings IMPRESSION:  1.  Mild to moderate L3 compression fracture which is new since 2009 and may be acute.  If specific therapy such as vertebroplasty is desired a nuclear medicine bone scan or MRI would best evaluate chronicity. 2.  Mild perinephric stranding greater on the left, this could be age related, related to acute renal insufficiency, or less likely renal infection.  Bilateral nephrolithiasis but no evidence of acute obstructive  uropathy. 3.  No other acute findings in the abdomen or pelvis.    Dg Chest Port 1 View  04/13/2011  *RADIOLOGY REPORT*  Clinical Data: Fever.  PORTABLE CHEST - 1 VIEW  Comparison: 07/01/2010.  Findings:   IMPRESSION:  1.  No acute cardiopulmonary disease or significant interval change. 2.  Stable cardiomegaly without failure. 3.  Emphysema.   Medications: Scheduled Meds:   . acetaminophen  500 mg Oral TID  . cefTRIAXone (ROCEPHIN) IV  1 g Intravenous Q24H  . docusate sodium  100 mg Oral BID  . ENSURE  1 Can Oral Daily  . levothyroxine  25 mcg Oral QAM  . potassium chloride  40 mEq Oral Once  . rosuvastatin  20 mg Oral Daily  . sertraline  50 mg Oral Daily  . warfarin  4 mg Oral ONCE-1800  . DISCONTD: donepezil  10 mg Oral QHS   Continuous Infusions:   . sodium chloride 50 mL/hr at 04/14/11 0845   PRN Meds:.acetaminophen, acetaminophen, acetaminophen-codeine, hydrALAZINE, HYDROcodone-acetaminophen, ondansetron (ZOFRAN) IV, ondansetron, polyethylene glycol  Assessment/Plan:  Principal Problem:  *Fever:  Resolved. Likely secondary to UTI  Active Problems:  Bradycardia:  Persistant over the last 24 hours.  Aricept discontinued 11/15, but no change.  Will ask cardiology to evaluate.   UTI (lower urinary tract infection) - On rocephin   Blood Culture - positive for Gram Neg Rods. - Should be covered by Rocephin.  Will adj antibiotics if ESBL   Compression fracture of L3 lumbar vertebra: Back pain - Better with K- Pad.  Awaiting PT/OT evaluation.   Hypothyroidism - TSH/Free T4 WNL   Dementia   DVT (deep venous thrombosis) - stable  Emphysema - stable  Thrombocytopenia - monitor     LOS: 2 days   YORK,Lowry Bala L 04/15/2011, 11:27 AM

## 2011-04-15 NOTE — Consult Note (Signed)
Cardiology Consult Note   Patient ID: Rebecca Dawson MRN: [id number], DOB/AGE: [id number]   Admit date: 04/13/2011 Date of Consult: @TODAY @  Primary Physician: Ginette Otto, MD, MD Primary Cardiologist: None   Pt. Profile: Rebecca Dawson is a 77yo   Female with PMHx significant for dementia, DVT/PE (s/p IVC filter chronically anticoagulated on Coumadin), systolic and diastolic dysfunction (per ECHO 07/02/10 revealing EF 30% and grade I diastolic dysfunction), HTN, on no AV nodal blockers who was admitted to [facility] with UTI bacteremia on 11/14. Cardiology consultation for intermittent episodes of bradycardia for the past 24 hours.    Problem List: Past Medical History  Diagnosis Date  . Dementia   . Hypertension   . GI bleed   . DVT (deep vein thrombosis) in pregnancy   . Dizziness     Past Surgical History  Procedure Date  . Abdominal hysterectomy      Allergies:  Allergies  Allergen Reactions  . Penicillins     Unknown reaction.    HPI:   Pt presented to [facility] ED on 11/14 c/o fever, chills and flank pain. U/A supported UTI. WBC count 5.2, but neutrophils were 94%. CXR revealed no acute cardiopulmonary disease, stable cardiomegaly and emphysematous changes.   She was admitted and gently hydrated, started on Rocephin IV and continued on outpatient Coumadin. CT abdomen/pelvic revealed L3 compression fracture, new since 2009; mild perinephric stranding determined to be either age-related or due to renal infection. Bilateral nonobstructive nephrolithiasis noted. Patient does have a history of hydronephrosis.   She was noted to have narcotic dependence on Oxycontin 10mg  BID outpatient. This was reduced to 5mg  q4hrs PRN for moderate pain in hopes not to worsen bradycarida or induce narcotic withdrawal. Aricept was discontinued. She was also noted to be thrombocytopenic at PLT 117.   She was found to be in sinus bradycardia on telemetry (bpm 37-52). She was  asymptomatic with this denying cp, SOB, dizziness, weakness, fatigue, n/v, palpitations, edema or diaphoresis.  She has been normotensive yesterday and hypertensive today (SBP 119-178). TSH and T4 were checked and found to be 1.035 and 0.89, respectively. 2D echocardiogram was ordered.   Of note, patient's husband passed away approximately 3 weeks ago.   Inpatient Medications:     . acetaminophen  500 mg Oral TID  . cefTRIAXone (ROCEPHIN) IV  1 g Intravenous Q24H  . docusate sodium  100 mg Oral BID  . ENSURE  1 Can Oral Daily  . levothyroxine  25 mcg Oral QAM  . potassium chloride  40 mEq Oral Once  . rosuvastatin  20 mg Oral Daily  . sertraline  50 mg Oral Daily  . warfarin  4 mg Oral ONCE-1800    Family History  Problem Relation Age of Onset  . Hypertension Other      History   Social History  . Marital Status: Married    Spouse Name: N/A    Number of Children: N/A  . Years of Education: N/A   Occupational History  . Not on file.   Social History Main Topics  . Smoking status: Never Smoker   . Smokeless tobacco: Not on file  . Alcohol Use: No  . Drug Use: No  . Sexually Active: Not Currently   Other Topics Concern  . Not on file   Social History Narrative  . No narrative on file     Review of Systems: General: negative for night sweats or weight changes.  Cardiovascular: negative for  chest pain, dyspnea on exertion, edema, orthopnea, palpitations, paroxysmal nocturnal dyspnea or shortness of breath Dermatological: negative for rash Respiratory: negative for cough or wheezing Urologic: negative for hematuria Abdominal: negative for nausea, vomiting, diarrhea, bright red blood per rectum, melena, or hematemesis Neurologic: negative for visual changes, syncope, or dizziness All other systems reviewed and are otherwise negative except as noted above.  Physical Exam: Blood pressure 169/83, pulse 65, temperature 97.4 F (36.3 C), temperature source Oral, resp.  rate 20, height 5\' 3"  (1.6 m), weight 57.38 kg (126 lb 8 oz), SpO2 95.00%.   General: Well developed, well nourished, in no acute distress. Head: Normocephalic, atraumatic, sclera non-icteric  Neck: Negative for carotid bruits. JVD not elevated, supple Lungs: Clear bilaterally to auscultation without wheezes, rales, or rhonchi. Breathing is unlabored. Heart: RRR with S1 S2. Grade III/VI systolic murmur at RUSB, LLSB, no heaves, gallops, thrills or rubs Abdomen: Soft, non-tender, non-distended with normoactive bowel sounds. No hepatomegaly. No rebound/guarding. No obvious abdominal masses. Msk:  Strength and tone appears normal for age. Extremities: No clubbing, cyanosis or edema.  Distal pedal pulses are 2+ and equal bilaterally. Neuro: Alert and oriented X 3. Moves all extremities spontaneously. Psych:  Responds to questions appropriately with a normal affect.  Labs:   Lab Results  Component Value Date   WBC 5.5 04/15/2011   HGB 11.7* 04/15/2011   HCT 35.6* 04/15/2011   MCV 97.5 04/15/2011   PLT 112* 04/15/2011     Lab 04/14/11 0700  NA 142  K 3.7  CL 109  CO2 24  BUN 16  CREATININE 0.87  CALCIUM 8.2*  PROT 5.2*  BILITOT 0.7  ALKPHOS 61  ALT 10  AST 19  GLUCOSE 82     Radiology/Studies: Ct Abdomen Pelvis Wo Contrast  04/14/2011  *RADIOLOGY REPORT*  Clinical Data: 77 year old female with back pain, urinary tract infection, fevers.  Query hydronephrosis or urologic stone.  CT ABDOMEN AND PELVIS WITHOUT CONTRAST  Technique:  Multidetector CT imaging of the abdomen and pelvis was performed following the standard protocol without intravenous contrast.  Comparison: 03/05/2008.  Findings: Dependent atelectasis.  Cardiomegaly.  No pericardial or pleural effusion.  Osteopenia.  Degenerative changes in the spine.  Scoliosis with rotatory component.  There is a mild to moderate L3 compression fracture which is new since 2009.  No retropulsion.  The L5 level is partially sacralized.   The sacrum appears intact.  There are chronic right pubic rami fractures.  No pelvic free fluid.  Foley catheter within the bladder which is almost completely decompressed.  Occasional sigmoid diverticula. Small fluid containing distal small bowel loops also noted in the vicinity.  No sigmoid inflammatory changes.  Extensive vascular calcifications.  Adnexal calcifications.  The uterus is surgically absent and the adnexa are within normal limits.  No abdominal free fluid.  Stool and gas and more proximal colon.  The right colon is redundant.  No focal colonic wall thickening.  Terminal ileum within normal limits.  No dilated small bowel.  Negative stomach and duodenum.  IVC filter in place.  The limbs of the filter seem to extend just beyond the walls of the IVC, and this is felt to be physiologic.  Chronic hypodense lesion in the liver dome is stable.  The noncontrast liver is otherwise within normal limits.  The gallbladder is mildly distended.  No pericholecystic stranding. Negative noncontrast spleen (stable calcified granulomas), pancreas, and adrenal glands.  Left lower pole nephrolithiasis measuring 10 mm.  Punctate left mid pole  nephrolithiasis.  No left hydronephrosis, hydroureter, or periureteric stranding.  Mild left perinephric stranding could be inflammatory or age related and does appear increased.  Punctate right nephrolithiasis at the mid pole.  No right hydronephrosis or right hydroureter.  Subtle right perinephric stranding, favor age related.  IMPRESSION:  1.  Mild to moderate L3 compression fracture which is new since 2009 and may be acute.  If specific therapy such as vertebroplasty is desired a nuclear medicine bone scan or MRI would best evaluate chronicity. 2.  Mild perinephric stranding greater on the left, this could be age related, related to acute renal insufficiency, or less likely renal infection.  Bilateral nephrolithiasis but no evidence of acute obstructive uropathy. 3.  No other  acute findings in the abdomen or pelvis.  Original Report Authenticated By: Harley Hallmark, M.D.   Dg Chest Port 1 View  04/13/2011  *RADIOLOGY REPORT*  Clinical Data: Fever.  PORTABLE CHEST - 1 VIEW  Comparison: 07/01/2010.  Findings: The heart is enlarged.  Chronic emphysematous changes are again noted.  No focal airspace disease is present.  There is no edema or effusion to suggest failure.  The visualized soft tissues and bony thorax are unremarkable.  IMPRESSION:  1.  No acute cardiopulmonary disease or significant interval change. 2.  Stable cardiomegaly without failure. 3.  Emphysema.  Original Report Authenticated By: Jamesetta Orleans. MATTERN, M.D.   2d ECHO  07/02/10  - Left ventricle: LVEF is approxmately 30% with diffuse hypokinesis.   The cavity size was normal. Wall thickness was normal. Doppler   parameters are consistent with abnormal left ventricular   relaxation (grade 1 diastolic dysfunction). - Aortic valve: AV is significantly thickened, calcified with   restricted motion. Peak and mean gradients through the valve are   26 and 14 mm Hg respectively consistent with mild AS. But,   probably low because of poor EF. Visually valve appears to be   severely narrowed. Consider TEE to define. - Mitral valve: Calcified annulus. Moderately thickened leaflets .   Mild regurgitation. - Pulmonary arteries: PA peak pressure: 51mm Hg (S).  04/15/11 Pending  EKG:   07/11/10 NSR, 61 bpm, LBBB, no acute ST-T waves changes, no Q waves  04/15/11 None reported  Telemetry:   Sinus bradycardia, 37 bpm at lowest point, from 1000-1200, bpm returned to NSR at 60 bpm, no other changes  ASSESSMENT AND PLAN:   1. Sinus bradycardia- 37-52 bpm per telemetry x 24 hours, this has persisted despite Aricept discontinuation and decrease in Oxycontin. Pt is otherwise asymptomatic as noted in HPI. Previous EKG in 02/12 revealed rate of 61 bpm pt's baseline could very well be borderline  bradycardic. TSH WNL, will await ECHO results, will order 12-lead EKG. If unremarkable, will continue to monitor as patient is asymptomatic without a decrease in her baseline activity level.   2. Hx of DVT- INR therapeutic, continue Coumadin per pharmacy  Signed,  Odella Aquas , PA-C 04/15/2011, 2:53 PM  History rereviewed with the patient, no changes.  The patient is clearly depressed over the death of her husband.  She has bradycardia but has had no recent symptoms related to this.  She denies presyncope or syncope.  She has no chest pain, neck or arm pain.  She has no SOB, PND or orthopnea.  The patient exam reveals apical systolic murmur radiating out the outflow tract and early peaking.  All available labs, radiology testing, previous records reviewed. Agree with documented assessment and plan.  No indication  for pacing or change in therapy.  She is bradycardic but this is currently not causing any obvious symptoms.   Fayrene Fearing Kamarion Zagami  4:44 PM 04/15/2011

## 2011-04-15 NOTE — Progress Notes (Signed)
Occupational Therapy Evaluation Patient Details Name: Rebecca Dawson MRN: [id number] DOB: April 17, 1918 Today's Date: 04/15/2011  Problem List:  Patient Active Problem List  Diagnoses  . Fever  . UTI (lower urinary tract infection)  . Back pain  . Hypothyroidism  . Dementia  . DVT (deep venous thrombosis)  . Emphysema  . Thrombocytopenia  . Compression fracture of L3 lumbar vertebra    Past Medical History:  Past Medical History  Diagnosis Date  . Dementia   . Hypertension   . GI bleed   . DVT (deep vein thrombosis) in pregnancy   . Dizziness    Past Surgical History:  Past Surgical History  Procedure Date  . Abdominal hysterectomy    Pt. Extremely emotional and cries several times with OT eval.  Noted increased BP this morning; checked with Georgann Housekeeper, PA prior to transferring patient to chair to make sure it was okay to do so.  [place] encouraged transferring patient to chair.  Psych consult has been requested.   Pt. Is grieving the loss of her husband and OT eval was limited.  OT Assessment/Plan/Recommendation OT Assessment Clinical Impression Statement: Pt. presents with bradycardia.  Pt. would benefit from OT services in acute care to increase participation in ADLs to supervision level for safe d/c to SNF for futher rehab prior to d/c ALF. OT Recommendation/Assessment: Patient will need skilled OT in the acute care venue OT Problem List: Decreased strength;Pain OT Therapy Diagnosis : Generalized weakness OT Plan OT Frequency: Min 1X/week OT Treatment/Interventions: Self-care/ADL training;Therapeutic activities;Patient/family education OT Recommendation Follow Up Recommendations: Skilled nursing facility Equipment Recommended: Defer to next venue Individuals Consulted Consulted and Agree with Results and Recommendations: Patient OT Goals Acute Rehab OT Goals OT Goal Formulation: With patient ADL Goals Pt Will Perform Grooming: Standing at sink;with  supervision ADL Goal: Grooming - Progress: Not met Pt Will Transfer to Toilet: with supervision;3-in-1;Regular height toilet ADL Goal: Toilet Transfer - Progress: Not met Pt Will Perform Toileting - Clothing Manipulation: with supervision;Standing ADL Goal: Toileting - Clothing Manipulation - Progress: Not met Pt Will Perform Toileting - Hygiene: with supervision;Sitting on 3-in-1 or toilet ADL Goal: Toileting - Hygiene - Progress: Not met  OT Evaluation Precautions/Restrictions  Precautions Precautions: Fall Required Braces or Orthoses: No Restrictions Weight Bearing Restrictions: No Prior Functioning Home Living Lives With: Other (Comment) (Assisted Living Facility: [facility]) Receives Help From: Other (Comment) (staff when needed)   ADL ADL Eating/Feeding: Performed;Independent Where Assessed - Eating/Feeding: Chair Grooming: Simulated;Set up Where Assessed - Grooming: Sitting, chair Upper Body Bathing: Simulated;Set up Where Assessed - Upper Body Bathing: Sitting, bed Lower Body Bathing: Not assessed Upper Body Dressing: Not assessed Lower Body Dressing: Not assessed Toilet Transfer: Minimal assistance Toilet Transfer Method: Ambulating Tub/Shower Transfer: Not assessed Vision/Perception    Cognition Cognition Arousal/Alertness: Awake/alert Overall Cognitive Status: Appears within functional limits for tasks assessed Orientation Level: Oriented to person Sensation/Coordination   Extremity Assessment RUE Assessment RUE Assessment: Within Functional Limits (strength at least 3/5: not tested formally due to spine fx) LUE Assessment LUE Assessment: Within Functional Limits (strength at least 3/5: not tested formally due to spine fx) Mobility  Bed Mobility Bed Mobility: Yes Supine to Sit: 6: Modified independent (Device/Increase time);HOB elevated (Comment degrees) Transfers Transfers: Yes Sit to Stand: 5: Supervision;From bed Stand to Sit: 5: Supervision;With  armrests;To chair/3-in-1 Exercises   End of Session OT - End of Session Equipment Utilized During Treatment: Gait belt Activity Tolerance: Patient tolerated treatment well (pt. extremely  emotional) Patient left: in chair Nurse Communication:  (notified RN Verdon Cummins) that pt. in chair) General Behavior During Session: [facility] for tasks performed Cognition: [facility] for tasks performed 04/15/2011 Kristl Morioka K. Beatrice-Mitchell, MS, OTR/L Occupational Therapist Acute Rehabilitation [facility] Phone: [phone] Pager: [phone] [email or web address]  Rickey Farrier Kirsten Beatrice-Mitchell 04/15/2011, 1:28 PM

## 2011-04-15 NOTE — Progress Notes (Signed)
Physical Therapy Evaluation Patient Details Name: Rebecca Dawson MRN: [id number] DOB: [birth year] Today's Date: 04/15/2011  Problem List:  Patient Active Problem List  Diagnoses  . Fever  . UTI (lower urinary tract infection)  . Back pain  . Hypothyroidism  . Dementia  . DVT (deep venous thrombosis)  . Emphysema  . Thrombocytopenia  . Compression fracture of L3 lumbar vertebra    Past Medical History:  Past Medical History  Diagnosis Date  . Dementia   . Hypertension   . GI bleed   . DVT (deep vein thrombosis) in pregnancy   . Dizziness    Past Surgical History:  Past Surgical History  Procedure Date  . Abdominal hysterectomy     PT Assessment/Plan/Recommendation PT Assessment Clinical Impression Statement: Pt. required supervision throughout session, should progress quickly with ambulation. Expressed that her low back hurts and that pain decreased while ambulating.  Will need to determine if 24 hour supervision can be provided at the assisted living facility for pt. to return as she wishes to.   PT Recommendation/Assessment: Patient will need skilled PT in the acute care venue PT Problem List: Decreased mobility;Decreased cognition;Pain Barriers to Discharge: Decreased caregiver support PT Therapy Diagnosis : Difficulty walking PT Plan PT Frequency: Min 3X/week PT Treatment/Interventions: DME instruction;Gait training;Functional mobility training;Patient/family education PT Recommendation Follow Up Recommendations: 24 hour supervision/assistance;Skilled nursing facility (Recommend skilled nursing if 24h supervision not available ) Equipment Recommended: None recommended by PT PT Goals  Acute Rehab PT Goals PT Goal Formulation: With patient Pt will go Supine/Side to Sit: Independently PT Goal: Supine/Side to Sit - Progress: Other (comment) Pt will go Sit to Supine/Side: Independently PT Goal: Sit to Supine/Side - Progress: Other (comment) Pt will Transfer Sit to  Stand/Stand to Sit: Independently PT Transfer Goal: Sit to Stand/Stand to Sit - Progress: Other (comment) Pt will Ambulate: >150 feet;with modified independence;with rolling walker PT Goal: Ambulate - Progress: Other (comment)  PT Evaluation Precautions/Restrictions  Precautions Precautions: Fall Required Braces or Orthoses: No Restrictions Weight Bearing Restrictions: No Prior Functioning  Home Living Lives With: Alone (per report to PT at ALF at [facility]) Receives Help From: Other (Comment) (staff when needed) Type of Home: Apartment Home Layout: One level Home Access: Level entry Bathroom Shower/Tub: Tub/shower unit Home Adaptive Equipment: Walker - rolling;Shower chair with back Prior Function Level of Independence: Independent with transfers;Requires assistive device for independence;Needs assistance with ADLs Bath: Moderate Vocation: Retired Financial risk analyst Arousal/Alertness: Awake/alert Overall Cognitive Status: Appears within functional limits for tasks assessed Orientation Level: Disoriented to time;Oriented to person;Oriented to place (Able to recall tremors but not fully aware of situation) Cognition - Other Comments: short term memory deficits Sensation/Coordination   Extremity Assessment RUE Assessment RUE Assessment: Within Functional Limits (strength at least 3/5: not tested formally due to spine fx) LUE Assessment LUE Assessment: Within Functional Limits (strength at least 3/5: not tested formally due to spine fx) Mobility (including Balance) Bed Mobility Bed Mobility: Yes Supine to Sit: HOB flat;5: Supervision Supine to Sit Details (indicate cue type and reason): For safety.  Sit to Supine - Left: 5: Supervision;HOB flat Sit to Supine - Left Details (indicate cue type and reason): For safety.  Transfers Transfers: Yes Sit to Stand: 5: Supervision;From bed Stand to Sit: 5: Supervision;To bed Ambulation/Gait Ambulation/Gait: Yes Ambulation/Gait  Assistance: 5: Supervision Ambulation/Gait Assistance Details (indicate cue type and reason): Cues for upright posture and to walk inside walker.  Ambulation Distance (Feet): 200 Feet Assistive device: Rolling walker Gait Pattern:  Decreased stride length (pt. stated she was walking "slower than usual")  Posture/Postural Control Posture/Postural Control: Postural limitations Postural Limitations: Kyphotic Exercise    End of Session PT - End of Session Equipment Utilized During Treatment: Gait belt Activity Tolerance: Patient tolerated treatment well Patient left: in bed;with call bell in reach Nurse Communication: Mobility status for transfers;Mobility status for ambulation General Behavior During Session: [facility] for tasks performed Cognition: Impaired Cognitive Impairment: short term memory deficits   Laney Pastor, SPT  04/15/2011, 4:36 PM

## 2011-04-15 NOTE — Progress Notes (Signed)
  Echocardiogram 2D Echocardiogram has been performed.  Cathie Beams Deneen 04/15/2011, 9:40 AM

## 2011-04-15 NOTE — Progress Notes (Signed)
Agree with student note.  Maxime Beckner Tabor, PT [phone]  

## 2011-04-15 NOTE — Progress Notes (Signed)
Svetlana Bagby, 77 year old white female, is grieving the Apr 21, 2011 death of her husband Ernie. Mack lives in an assisted living facility next door to [name] where she once lived.  Patient has a son Raiford Noble [phone] in [place], [place]; and Michael's wife recently had surgery, and cannot visit his mother at this time.  But Rochanda also has a daughter in [place] named [name] who has not been on speaking terms with Tracye for many years.  This estrangement is also a source of grief for Colburn.  Jodine has a very supportive friendship with [name] [phone] who lives at [name].  Adela Lank and his late wife were very close to [place]  Chaplain provided prayer, conversation, and presence to [place], who was most appreciative.  Chaplain will follow-up by trying to contact patient's daughter Francesca Oman.

## 2011-04-15 NOTE — Progress Notes (Signed)
ANTICOAGULATION CONSULT NOTE - Follow Up Consult  Pharmacy Consult for Coumadin Indication: history of DVT  Allergies  Allergen Reactions  . Penicillins     Unknown reaction.    Patient Measurements: Height: 5\' 3"  (160 cm) Weight: 126 lb 8 oz (57.38 kg) IBW/kg (Calculated) : 52.4   Vital Signs: Temp: 98.2 F (36.8 C) (11/16 0536) Temp src: Oral (11/16 0536) BP: 174/76 mmHg (11/16 0645) Pulse Rate: 52  (11/16 0645)  Labs:  Basename 04/15/11 0620 04/14/11 0700 04/13/11 1829  HGB 11.7* 11.2* --  HCT 35.6* 34.4* 36.4  PLT 112* 117* 110*  APTT -- -- --  LABPROT 24.1* 28.9* 28.7*  INR 2.12* 2.67* 2.65*  HEPARINUNFRC -- -- --  CREATININE -- 0.87 0.84  CKTOTAL -- -- --  CKMB -- -- --  TROPONINI -- -- --   Estimated Creatinine Clearance: 34.8 ml/min (by C-G formula based on Cr of 0.87).   Medications:  Prescriptions prior to admission  Medication Sig Dispense Refill  . acetaminophen (TYLENOL) 500 MG tablet Take 500 mg by mouth 3 (three) times daily.        Marland Kitchen acetaminophen-codeine (TYLENOL #3) 300-30 MG per tablet Take 1 tablet by mouth every 6 (six) hours as needed. For pain       . alendronate (FOSAMAX) 70 MG tablet Take 70 mg by mouth every 7 (seven) days. Take with a full glass of water on an empty stomach.       . calcium-vitamin D (OSCAL WITH D) 500-200 MG-UNIT per tablet Take 1 tablet by mouth daily.        Marland Kitchen docusate sodium (COLACE) 100 MG capsule Take 100 mg by mouth 2 (two) times daily.        Marland Kitchen donepezil (ARICEPT) 10 MG tablet Take 10 mg by mouth at bedtime.        . ENSURE (ENSURE) Take 1 Can by mouth daily.        Marland Kitchen levothyroxine (SYNTHROID, LEVOTHROID) 25 MCG tablet Take 25 mcg by mouth every morning.        . OxyCODONE HCl (OXYCONTIN PO) Take 10 mg by mouth 2 (two) times daily.        . polyethylene glycol (MIRALAX / GLYCOLAX) packet Take 17 g by mouth daily as needed. For constipation       . rosuvastatin (CRESTOR) 20 MG tablet Take 20 mg by mouth daily.         . sertraline (ZOLOFT) 50 MG tablet Take 50 mg by mouth daily.        Marland Kitchen warfarin (COUMADIN) 4 MG tablet Take 4 mg by mouth every other day.        . warfarin (COUMADIN) 5 MG tablet Take 5 mg by mouth every other day.          Assessment: History of DVT: INR remains therapeutic on her PTA Coumadin regimen.  Note she is on Rocephin for UTI but this antibiotic usually does not have a significant interaction with Coumadin. UTI: On Day #3 of empiric Rocephin.  Urine culture is pending.  Goal of Therapy:  INR 2-3   Plan:  Coumadin 4mg  today per her PTA regimen. Continue daily PT/INR monitoring.  Estella Husk, Pharm.D., BCPS Clinical Pharmacist  Pager [phone] 04/15/2011, 10:43 AM

## 2011-04-15 NOTE — Progress Notes (Signed)
Addendum  Patient seen and examined, chart and data base reviewed.  I agree with the above assessment and plan  For full details please see Mrs. Algis Downs PA. note.  Has GNR bacteremia, UTI, Aortic stenosis and bradycardia.  Pt is very tearful, her husband passed away recently, Psych consulted.  Priseis Cratty A  04/15/2011, 2:22 PM

## 2011-04-15 NOTE — Consult Note (Addendum)
Patient Identification:  Rebecca Dawson Date of Evaluation:  04/15/2011   History of Present Illness: 77 year old female with H/O DVT on coumadin, dementia chronic pain was brought to ER because of fever and chills for one day. Patient reported depressed mood because of her husband that recently. She lives in a nursing home. She reported difficulty in sleep at night and have passive suicidal ideations. Patient is logical and goal-directed pleasant on approach. Not hallucinating or delusional. Alert awake oriented x3 memory immediate recent and remote fair attention and concentration fair insight and judgment fair. As per chaplain note Rebecca Dawson is grieving the 2011-04-29 death of her husband Rebecca Dawson. Rebecca Dawson lives in an assisted living facility next door to Rebecca Dawson where she once lived. Patient has a son Rebecca Dawson [phone] in [place], [place]; and Rebecca Dawson's wife recently had surgery, and cannot visit his mother at this time. But Rebecca Dawson also has a daughter in [place] named Rebecca Dawson who has not been on speaking terms with [name] for many years. This estrangement is also a source of grief for Rebecca Dawson. Rebecca Dawson has a very supportive friendship with Rebecca Dawson [phone] who lives at Rebecca Dawson.    Past Medical History:     Past Medical History  Diagnosis Date  . Dementia   . Hypertension   . GI bleed   . DVT (deep vein thrombosis) in pregnancy   . Dizziness        Past Surgical History  Procedure Date  . Abdominal hysterectomy     Allergies:  Allergies  Allergen Reactions  . Penicillins     Unknown reaction.    Current Medications:  Prior to Admission medications   Medication Sig Start Date End Date Taking? Authorizing Provider  acetaminophen (TYLENOL) 500 MG tablet Take 500 mg by mouth 3 (three) times daily.     Yes Historical Provider, MD  acetaminophen-codeine (TYLENOL #3) 300-30 MG per tablet Take 1 tablet by mouth every 6 (six) hours as  needed. For pain    Yes Historical Provider, MD  alendronate (FOSAMAX) 70 MG tablet Take 70 mg by mouth every 7 (seven) days. Take with a full glass of water on an empty stomach.    Yes Historical Provider, MD  calcium-vitamin D (OSCAL WITH D) 500-200 MG-UNIT per tablet Take 1 tablet by mouth daily.     Yes Historical Provider, MD  docusate sodium (COLACE) 100 MG capsule Take 100 mg by mouth 2 (two) times daily.     Yes Historical Provider, MD  donepezil (ARICEPT) 10 MG tablet Take 10 mg by mouth at bedtime.     Yes Historical Provider, MD  ENSURE (ENSURE) Take 1 Can by mouth daily.     Yes Historical Provider, MD  levothyroxine (SYNTHROID, LEVOTHROID) 25 MCG tablet Take 25 mcg by mouth every morning.     Yes Historical Provider, MD  OxyCODONE HCl (OXYCONTIN PO) Take 10 mg by mouth 2 (two) times daily.     Yes Historical Provider, MD  polyethylene glycol (MIRALAX / GLYCOLAX) packet Take 17 g by mouth daily as needed. For constipation    Yes Historical Provider, MD  rosuvastatin (CRESTOR) 20 MG tablet Take 20 mg by mouth daily.     Yes Historical Provider, MD  sertraline (ZOLOFT) 50 MG tablet Take 50 mg by mouth daily.     Yes Historical Provider, MD  warfarin (COUMADIN) 4 MG tablet Take 4 mg by mouth every other day.     Yes  Historical Provider, MD  warfarin (COUMADIN) 5 MG tablet Take 5 mg by mouth every other day.     Yes Historical Provider, MD    Social History:    reports that she has never smoked. She does not have any smokeless tobacco history on file. She reports that she does not drink alcohol or use illicit drugs.   Family History:    Family History  Problem Relation Age of Onset  . Hypertension Other      Assessment:   AXIS I  Depressive disorder NOS,  Adjustment disorder depressed type.  AXIS II  Deffered  AXIS III See medical notes.  AXIS IV  recent death of the husband.   AXIS V 51-60 moderate symptoms     Recommendations: #1 continue current treatment plan.   #2  supportive psychotherapy given to the patient  #3 Zoloft 50 mg can be continued.   #4 Will follow up with patient as needed.      Eulogio Ditch, MD 11/6/201211:40 AM

## 2011-04-15 NOTE — Progress Notes (Signed)
Manual BP 186/80, 48P. Pt asymptomatic. Notified MD. Orders received to admin IV Hydralazine. New BP 174/76. Will continue to monitor. -- Tacey Heap RN

## 2011-04-16 LAB — BASIC METABOLIC PANEL
BUN: 12 mg/dL (ref 6–23)
Creatinine, Ser: 0.86 mg/dL (ref 0.50–1.10)
GFR calc Af Amer: 66 mL/min — ABNORMAL LOW (ref >90)
GFR calc non Af Amer: 57 mL/min — ABNORMAL LOW (ref >90)

## 2011-04-16 LAB — CBC
HCT: 36.7 % (ref 36.0–46.0)
MCHC: 33.2 g/dL (ref 30.0–36.0)
MCV: 96.3 fL (ref 78.0–100.0)
Platelets: 130 10*3/uL — ABNORMAL LOW (ref 150–400)
RDW: 13.3 % (ref 11.5–15.5)

## 2011-04-16 LAB — PROTIME-INR: INR: 1.77 — ABNORMAL HIGH (ref 0.00–1.49)

## 2011-04-16 MED ORDER — WARFARIN SODIUM 5 MG PO TABS
5.0000 mg | ORAL_TABLET | Freq: Once | ORAL | Status: AC
  Administered 2011-04-16: 5 mg via ORAL
  Filled 2011-04-16: qty 1

## 2011-04-16 NOTE — Progress Notes (Signed)
CSW assessment completed.  Will need to f/u Monday to determine if ALF can accommodate Pt needs. Milus Banister MSW,LCSW w/e Coverage [phone]

## 2011-04-16 NOTE — Plan of Care (Signed)
Problem: Phase I Progression Outcomes Goal: OOB as tolerated unless otherwise ordered Outcome: Completed/Met Date Met:  04/16/11 Pt OOB to chair and ambulating in the halls with Rolling walker and 1 assist

## 2011-04-16 NOTE — Progress Notes (Signed)
Subjective:  Feels better today, no episodes of crying. Seen by cardiology and psych. Sitting on chair eating her lunch.  Objective: Vital signs in last 24 hours:  Filed Vitals:   04/15/11 1440 04/15/11 2129 04/15/11 2300 04/16/11 0506  BP: 169/83 157/70  174/81  Pulse: 65 56  54  Temp: 97.4 F (36.3 C) 98 F (36.7 C)  98.1 F (36.7 C)  TempSrc: Oral     Resp: 20 20  20   Height:      Weight:   55.8 kg (123 lb 0.3 oz) 57.3 kg (126 lb 5.2 oz)  SpO2: 95% 94%  94%    Intake/Output from previous day:   Intake/Output Summary (Last 24 hours) at 04/16/11 1320 Last data filed at 04/16/11 1034  Gross per 24 hour  Intake    723 ml  Output   1903 ml  Net  -1180 ml    Physical Exam: The patient is an elderly frail female Chest: is clear to auscultation bilaterally without wheezes crackles or rales Heart: sounds bradycardic with systolic murmur likely aortic stenosis. No gallops. Abdomen: thin soft nontender nondistended with bowel sounds Extremities: show no edema she is able to move all 4 extremities. Psychiatric: The patient is crying, it is difficult to keep her on topic. She appears acutely severely depressed  Lab Results:  Basic Metabolic Panel:    Component Value Date/Time   NA 141 04/16/2011 0500   K 3.7 04/16/2011 0500   CL 110 04/16/2011 0500   CO2 22 04/16/2011 0500   BUN 12 04/16/2011 0500   CREATININE 0.86 04/16/2011 0500   GLUCOSE 96 04/16/2011 0500   CALCIUM 9.1 04/16/2011 0500   CBC:    Component Value Date/Time   WBC 5.1 04/16/2011 0500   HGB 12.2 04/16/2011 0500   HCT 36.7 04/16/2011 0500   PLT 130* 04/16/2011 0500   MCV 96.3 04/16/2011 0500   NEUTROABS 4.9 04/13/2011 1829   LYMPHSABS 0.2* 04/13/2011 1829   MONOABS 0.1 04/13/2011 1829   EOSABS 0.0 04/13/2011 1829   BASOSABS 0.0 04/13/2011 1829    Recent Results (from the past 240 hour(s))  CULTURE, BLOOD (ROUTINE X 2)     Status: Normal (Preliminary result)   Collection Time   04/13/11   6:28 PM      Component Value Range Status Comment   Specimen Description BLOOD ARM RIGHT   Final    Special Requests BOTTLES DRAWN AEROBIC AND ANAEROBIC 10CC   Final    Setup Time [id number]   Final    Culture     Final    Value: GRAM NEGATIVE RODS     Note: Gram Stain Report Called to,Read Back By and Verified With: Lysbeth Galas @ 1125 04/15/11 WICKN   Report Status PENDING   Incomplete   CULTURE, BLOOD (ROUTINE X 2)     Status: Normal (Preliminary result)   Collection Time   04/13/11  6:38 PM      Component Value Range Status Comment   Specimen Description BLOOD HAND RIGHT   Final    Special Requests BOTTLES DRAWN AEROBIC ONLY 10CC   Final    Setup Time [id number]   Final    Culture     Final    Value:        BLOOD CULTURE RECEIVED NO GROWTH TO DATE CULTURE WILL BE HELD FOR 5 DAYS BEFORE ISSUING A FINAL NEGATIVE REPORT   Report Status PENDING   Incomplete   MRSA PCR SCREENING  Status: Normal   Collection Time   04/14/11 12:44 AM      Component Value Range Status Comment   MRSA by PCR NEGATIVE  NEGATIVE  Final   URINE CULTURE     Status: Normal   Collection Time   04/14/11 12:12 PM      Component Value Range Status Comment   Specimen Description URINE, CATHETERIZED   Final    Special Requests NONE   Final    Setup Time [id number]   Final    Colony Count 35,000 COLONIES/ML   Final    Culture     Final    Value: Multiple bacterial morphotypes present, none predominant. Suggest appropriate recollection if clinically indicated.   Report Status 04/15/2011 FINAL   Final     Studies/Results: Ct Abdomen Pelvis Wo Contrast  04/14/2011  *RADIOLOGY REPORT*  Clinical Data: 77 year old female with back pain, urinary tract infection, fevers.  Query hydronephrosis or urologic stone.  CT ABDOMEN AND PELVIS WITHOUT CONTRAST  Technique:  Multidetector CT imaging of the abdomen and pelvis was performed following the standard protocol without intravenous contrast.  Comparison:  03/05/2008.  Findings IMPRESSION:  1.  Mild to moderate L3 compression fracture which is new since 2009 and may be acute.  If specific therapy such as vertebroplasty is desired a nuclear medicine bone scan or MRI would best evaluate chronicity. 2.  Mild perinephric stranding greater on the left, this could be age related, related to acute renal insufficiency, or less likely renal infection.  Bilateral nephrolithiasis but no evidence of acute obstructive uropathy. 3.  No other acute findings in the abdomen or pelvis.    Dg Chest Port 1 View  04/13/2011  *RADIOLOGY REPORT*  Clinical Data: Fever.  PORTABLE CHEST - 1 VIEW  Comparison: 07/01/2010.  Findings:   IMPRESSION:  1.  No acute cardiopulmonary disease or significant interval change. 2.  Stable cardiomegaly without failure. 3.  Emphysema.   Medications: Scheduled Meds:    . acetaminophen  500 mg Oral TID  . cefTRIAXone (ROCEPHIN) IV  1 g Intravenous Q24H  . docusate sodium  100 mg Oral BID  . ENSURE  1 Can Oral Daily  . levothyroxine  25 mcg Oral QAM  . rosuvastatin  20 mg Oral Daily  . sertraline  50 mg Oral Daily  . warfarin  4 mg Oral ONCE-1800  . warfarin  5 mg Oral ONCE-1800   Continuous Infusions:  PRN Meds:.acetaminophen, acetaminophen, acetaminophen-codeine, hydrALAZINE, HYDROcodone-acetaminophen, ondansetron (ZOFRAN) IV, ondansetron, polyethylene glycol  Assessment/Plan: Patient Active Problem List  Diagnoses  . Fever  . UTI (lower urinary tract infection)  . Back pain  . Hypothyroidism  . Dementia  . DVT (deep venous thrombosis)  . Emphysema  . Thrombocytopenia  . Compression fracture of L3 lumbar vertebra  . Bradycardia     Principal Problem:  *Fever:  Resolved. Likely secondary to UTI  Active Problems:  Gram-negative bacteremia: This is likely secondary to UTI, final identification on the GNR is pending, patient is on Rocephin seems controlling the bacteremia patient does not have any fever or leukocytosis,  overall she is improving.   Bradycardia:  Persistant over the last 24 hours.  Aricept discontinued 11/15, but no change.  cardiology saw in consultation recommended no changes in medications and close monitoring as patient is asymptomatic. No indications for pacemaker   UTI (lower urinary tract infection) - On rocephin, the urine culture show multiple organisms.   Compression fracture of L3 lumbar vertebra: Back pain -  Better with K- Pad.  Awaiting PT/OT evaluation.   Hypothyroidism - TSH/Free T4 WNL   Dementia: Stable, patient of Aricept because of bradycardia.        LOS: 3 days   Donni Oglesby A 04/16/2011, 1:20 PM

## 2011-04-16 NOTE — Progress Notes (Signed)
ANTICOAGULATION CONSULT NOTE - Follow Up Consult  Pharmacy Consult for Coumadin Indication: DVT  Allergies  Allergen Reactions  . Penicillins     Unknown reaction.    Patient Measurements: Height: 5\' 3"  (160 cm) Weight: 126 lb 5.2 oz (57.3 kg) IBW/kg (Calculated) : 52.4  Adjusted Body Weight:   Vital Signs: Temp: 98.1 F (36.7 C) (11/17 0506) BP: 174/81 mmHg (11/17 0506) Pulse Rate: 54  (11/17 0506)  Labs:  Basename 04/16/11 0500 04/15/11 0620 04/14/11 0700 04/13/11 1829  HGB 12.2 11.7* -- --  HCT 36.7 35.6* 34.4* --  PLT 130* 112* 117* --  APTT -- -- -- --  LABPROT 20.9* 24.1* 28.9* --  INR 1.77* 2.12* 2.67* --  HEPARINUNFRC -- -- -- --  CREATININE 0.86 -- 0.87 0.84  CKTOTAL -- -- -- --  CKMB -- -- -- --  TROPONINI -- -- -- --   Estimated Creatinine Clearance: 35.2 ml/min (by C-G formula based on Cr of 0.86).   Medications:  Scheduled:    . acetaminophen  500 mg Oral TID  . cefTRIAXone (ROCEPHIN) IV  1 g Intravenous Q24H  . docusate sodium  100 mg Oral BID  . ENSURE  1 Can Oral Daily  . levothyroxine  25 mcg Oral QAM  . rosuvastatin  20 mg Oral Daily  . sertraline  50 mg Oral Daily  . warfarin  4 mg Oral ONCE-1800    Assessment: 77 yo with a h/o DVT who was admitted for GNR sepsis. Her INR was therapeutic but has decreased to 1.77 today. She is currently on rocephin for her sepsis. Goal of Therapy:  INR 2-3   Plan:  1. Coumadin 5mg  PO x1 2. F/u with INR in AM  Ulyses Southward Cornish 04/16/2011,12:39 PM

## 2011-04-17 LAB — PROTIME-INR: Prothrombin Time: 20.7 seconds — ABNORMAL HIGH (ref 11.6–15.2)

## 2011-04-17 LAB — CULTURE, BLOOD (ROUTINE X 2)

## 2011-04-17 MED ORDER — WARFARIN SODIUM 5 MG PO TABS
5.0000 mg | ORAL_TABLET | Freq: Once | ORAL | Status: AC
  Administered 2011-04-17: 5 mg via ORAL
  Filled 2011-04-17: qty 1

## 2011-04-17 NOTE — Progress Notes (Signed)
FL2 completed for MD signature. Milus Banister MSW,LCSW w/e Coverage [phone]

## 2011-04-17 NOTE — Progress Notes (Signed)
ANTICOAGULATION CONSULT NOTE - Follow Up Consult  Pharmacy Consult for Coumadin  Indication: Hx/o DVT  Assessment:  77 yo female with a hx/o DVT who was admitted for GNR sepsis, currently on rocephin. INR today is 1.74 (down from 2.67 on home regimen). Home dose is alternating days of 4mg  and 5mg . No bleeding noted.  Goal of Therapy:  INR 2-3   Plan:  1. Coumadin 5mg  PO x1 today. Check INR in AM.  Ival Bible 04/17/2011,11:58 AM  Allergies  Allergen Reactions  . Penicillins     Unknown reaction.    Patient Measurements: Height: 5\' 3"  (160 cm) Weight: 126 lb 5.2 oz (57.3 kg) IBW/kg (Calculated) : 52.4   Vital Signs: Temp: 97.9 F (36.6 C) (11/18 0508) BP: 157/71 mmHg (11/18 0508) Pulse Rate: 72  (11/18 0508)  Labs:  Basename 04/17/11 0730 04/16/11 0500 04/15/11 0620  HGB -- 12.2 11.7*  HCT -- 36.7 35.6*  PLT -- 130* 112*  APTT -- -- --  LABPROT 20.7* 20.9* 24.1*  INR 1.74* 1.77* 2.12*  HEPARINUNFRC -- -- --  CREATININE -- 0.86 --  CKTOTAL -- -- --  CKMB -- -- --  TROPONINI -- -- --   Estimated Creatinine Clearance: 35.2 ml/min (by C-G formula based on Cr of 0.86).   Medications:  Scheduled:    . acetaminophen  500 mg Oral TID  . cefTRIAXone (ROCEPHIN) IV  1 g Intravenous Q24H  . docusate sodium  100 mg Oral BID  . ENSURE  1 Can Oral Daily  . levothyroxine  25 mcg Oral QAM  . rosuvastatin  20 mg Oral Daily  . sertraline  50 mg Oral Daily  . warfarin  5 mg Oral ONCE-1800

## 2011-04-17 NOTE — Progress Notes (Signed)
Subjective:  Feels better today, no episodes of crying. Seen by cardiology and psych. Sitting on chair eating her lunch.  Objective: Vital signs in last 24 hours:  Filed Vitals:   04/16/11 0506 04/16/11 1434 04/16/11 2114 04/17/11 0508  BP: 174/81 150/76 168/72 157/71  Pulse: 54 65 58 72  Temp: 98.1 F (36.7 C) 98.2 F (36.8 C) 97.6 F (36.4 C) 97.9 F (36.6 C)  TempSrc:      Resp: 20 19 19 18   Height:      Weight: 57.3 kg (126 lb 5.2 oz)     SpO2: 94% 95% 95% 95%    Intake/Output from previous day:   Intake/Output Summary (Last 24 hours) at 04/17/11 1342 Last data filed at 04/17/11 1300  Gross per 24 hour  Intake   1180 ml  Output    592 ml  Net    588 ml    Physical Exam: The patient is an elderly frail female Chest: is clear to auscultation bilaterally without wheezes crackles or rales Heart: sounds bradycardic with systolic murmur likely aortic stenosis. No gallops. Abdomen: thin soft nontender nondistended with bowel sounds Extremities: show no edema she is able to move all 4 extremities. Psychiatric: The patient is crying, it is difficult to keep her on topic. She appears acutely severely depressed  Lab Results:  Basic Metabolic Panel:    Component Value Date/Time   NA 141 04/16/2011 0500   K 3.7 04/16/2011 0500   CL 110 04/16/2011 0500   CO2 22 04/16/2011 0500   BUN 12 04/16/2011 0500   CREATININE 0.86 04/16/2011 0500   GLUCOSE 96 04/16/2011 0500   CALCIUM 9.1 04/16/2011 0500   CBC:    Component Value Date/Time   WBC 5.1 04/16/2011 0500   HGB 12.2 04/16/2011 0500   HCT 36.7 04/16/2011 0500   PLT 130* 04/16/2011 0500   MCV 96.3 04/16/2011 0500   NEUTROABS 4.9 04/13/2011 1829   LYMPHSABS 0.2* 04/13/2011 1829   MONOABS 0.1 04/13/2011 1829   EOSABS 0.0 04/13/2011 1829   BASOSABS 0.0 04/13/2011 1829    Recent Results (from the past 240 hour(s))  CULTURE, BLOOD (ROUTINE X 2)     Status: Normal   Collection Time   04/13/11  6:28 PM   Component Value Range Status Comment   Specimen Description BLOOD ARM RIGHT   Final    Special Requests BOTTLES DRAWN AEROBIC AND ANAEROBIC 10CC   Final    Setup Time [id number]   Final    Culture     Final    Value: ESCHERICHIA COLI     Note: Gram Stain Report Called to,Read Back By and Verified With: Lysbeth Galas @ 1125 04/15/11 WICKN   Report Status 04/17/2011 FINAL   Final    Organism ID, Bacteria ESCHERICHIA COLI   Final   CULTURE, BLOOD (ROUTINE X 2)     Status: Normal (Preliminary result)   Collection Time   04/13/11  6:38 PM      Component Value Range Status Comment   Specimen Description BLOOD HAND RIGHT   Final    Special Requests BOTTLES DRAWN AEROBIC ONLY 10CC   Final    Setup Time [id number]   Final    Culture     Final    Value:        BLOOD CULTURE RECEIVED NO GROWTH TO DATE CULTURE WILL BE HELD FOR 5 DAYS BEFORE ISSUING [facility] FINAL NEGATIVE REPORT   Report Status PENDING   Incomplete  MRSA PCR SCREENING     Status: Normal   Collection Time   04/14/11 12:44 AM      Component Value Range Status Comment   MRSA by PCR NEGATIVE  NEGATIVE  Final   URINE CULTURE     Status: Normal   Collection Time   04/14/11 12:12 PM      Component Value Range Status Comment   Specimen Description URINE, CATHETERIZED   Final    Special Requests NONE   Final    Setup Time [id number]   Final    Colony Count 35,000 COLONIES/ML   Final    Culture     Final    Value: Multiple bacterial morphotypes present, none predominant. Suggest appropriate recollection if clinically indicated.   Report Status 04/15/2011 FINAL   Final     Studies/Results: Ct Abdomen Pelvis Wo Contrast  04/14/2011  *RADIOLOGY REPORT*  Clinical Data: 77 year old female with back pain, urinary tract infection, fevers.  Query hydronephrosis or urologic stone.  CT ABDOMEN AND PELVIS WITHOUT CONTRAST  Technique:  Multidetector CT imaging of the abdomen and pelvis was performed following the standard protocol without  intravenous contrast.  Comparison: 03/05/2008.  Findings IMPRESSION:  1.  Mild to moderate L3 compression fracture which is new since 2009 and may be acute.  If specific therapy such as vertebroplasty is desired [facility] nuclear medicine bone scan or MRI would best evaluate chronicity. 2.  Mild perinephric stranding greater on the left, this could be age related, related to acute renal insufficiency, or less likely renal infection.  Bilateral nephrolithiasis but no evidence of acute obstructive uropathy. 3.  No other acute findings in the abdomen or pelvis.    Dg Chest Port 1 View  04/13/2011  *RADIOLOGY REPORT*  Clinical Data: Fever.  PORTABLE CHEST - 1 VIEW  Comparison: 07/01/2010.  Findings:   IMPRESSION:  1.  No acute cardiopulmonary disease or significant interval change. 2.  Stable cardiomegaly without failure. 3.  Emphysema.   Medications: Scheduled Meds:    . acetaminophen  500 mg Oral TID  . cefTRIAXone (ROCEPHIN) IV  1 g Intravenous Q24H  . docusate sodium  100 mg Oral BID  . ENSURE  1 Can Oral Daily  . levothyroxine  25 mcg Oral QAM  . rosuvastatin  20 mg Oral Daily  . sertraline  50 mg Oral Daily  . warfarin  5 mg Oral ONCE-1800  . warfarin  5 mg Oral ONCE-1800   Continuous Infusions:  PRN Meds:.acetaminophen, acetaminophen, acetaminophen-codeine, hydrALAZINE, HYDROcodone-acetaminophen, ondansetron (ZOFRAN) IV, ondansetron, polyethylene glycol  Assessment/Plan: Patient Active Problem List  Diagnoses  . Fever  . UTI (lower urinary tract infection)  . Back pain  . Hypothyroidism  . Dementia  . DVT (deep venous thrombosis)  . Emphysema  . Thrombocytopenia  . Compression fracture of L3 lumbar vertebra  . Bradycardia     Principal Problem:  *Fever:  Resolved. Likely secondary to UTI  Active Problems:  Escherichia coli bacteremia: This is likely secondary to UTI, patient is on Rocephin currently, the Escherichia coli is pansensitive. Patient does not have fever nor chills,  probably can be discharged home on high doses of oral ciprofloxacin to complete 14 days worth of antibiotics  Bradycardia:  Persistant over the last 24 hours.  Aricept discontinued 11/15, but no change.  cardiology saw in consultation recommended no changes in medications and close monitoring as patient is asymptomatic. No indications for pacemaker  UTI (lower urinary tract infection) - On rocephin, the urine  culture show multiple organisms.   Compression fracture of L3 lumbar vertebra: Back pain - Better with K- Pad.  Awaiting PT/OT evaluation.  Hypothyroidism - TSH/Free T4 WNL  Dementia: Stable, patient of Aricept because of bradycardia.   Disposition: Patient likely will go back to assisted-living facility in the morning      LOS: 4 days   Rebecca Dawson 04/17/2011, 1:42 PM

## 2011-04-17 NOTE — Plan of Care (Signed)
Problem: Phase I Progression Outcomes Goal: Voiding-avoid urinary catheter unless indicated Outcome: Completed/Met Date Met:  04/17/11 Foley catheter removed 11/18, patient has voided since removal Goal: Adequate I & O Outcome: Adequate for Discharge Pt. Is consuming around 50% of all meals and is supplemented with ensure. Goal: Other Phase I Outcomes/Goals Outcome: Progressing Pt. INR is not therapeutic but pharmacy is dosing based on daily INR  Problem: Phase II Progression Outcomes Goal: Progress activity as tolerated unless otherwise ordered Outcome: Completed/Met Date Met:  04/17/11 Pt. Is ambulating in halls with 1 assist and rolling walker.

## 2011-04-18 LAB — PROTIME-INR: Prothrombin Time: 21.5 seconds — ABNORMAL HIGH (ref 11.6–15.2)

## 2011-04-18 MED ORDER — WARFARIN SODIUM 5 MG PO TABS
5.0000 mg | ORAL_TABLET | Freq: Once | ORAL | Status: DC
  Filled 2011-04-18: qty 1

## 2011-04-18 MED ORDER — CIPROFLOXACIN HCL 750 MG PO TABS: 750.0000 mg | ORAL_TABLET | Freq: Two times a day (BID) | ORAL | Status: AC

## 2011-04-18 NOTE — Progress Notes (Signed)
Physical Therapy Treatment Patient Details Name: JOSELINNE LAWAL MRN: [id number] DOB: 1918-03-11 Today's Date: 04/18/2011  PT Assessment/Plan  PT - Assessment/Plan Comments on Treatment Session: Pt tolerated treatment well and has shown improvement. Pt tearful 2x during session secondary to husbands recent death. Pt supervision with mobility with RW. Spoke with CM and SW concerning D/C plan and need for RW prior to D/C PT Plan: Discharge plan needs to be updated PT Frequency: Min 3X/week Follow Up Recommendations: Home health PT (Back to ALF) Equipment Recommended: Rolling walker with 5" wheels PT Goals  Acute Rehab PT Goals PT Goal Formulation: With patient Pt will go Supine/Side to Sit: Independently PT Goal: Supine/Side to Sit - Progress: Met (per RN) Pt will go Sit to Supine/Side: Independently (met per RN) PT Goal: Sit to Supine/Side - Progress: Met Pt will Transfer Sit to Stand/Stand to Sit: Independently PT Transfer Goal: Sit to Stand/Stand to Sit - Progress: Progressing toward goal Pt will Ambulate: >150 feet;with modified independence;with rolling walker PT Goal: Ambulate - Progress: Progressing toward goal  PT Treatment Precautions/Restrictions  Precautions Precautions: Fall Required Braces or Orthoses: No Restrictions Weight Bearing Restrictions: No Mobility (including Balance) Bed Mobility Bed Mobility: No (seated at start) Transfers Transfers: Yes Sit to Stand: 6: Modified independent (Device/Increase time);From bed;From toilet Sit to Stand Details (indicate cue type and reason): increased time, performed 2x.  Stand to Sit: 6: Modified independent (Device/Increase time) Stand to Sit Details: increased time, performed 2x.  Ambulation/Gait Ambulation/Gait: Yes Ambulation/Gait Assistance: 5: Supervision Ambulation/Gait Assistance Details (indicate cue type and reason): Verbal cues for improved posture.  Ambulation Distance (Feet): 250 Feet Assistive device:  Rolling walker;1 person hand held assist (25 feet with hand hold assist, 225 with RW) Gait Pattern: Trunk flexed;Decreased stride length (deficits increased without RW) Gait velocity: much slower without RW  Posture/Postural Control Posture/Postural Control: Postural limitations Postural Limitations: kyphotic Balance Balance Assessed: Yes Static Standing Balance Static Standing - Balance Support: No upper extremity supported Static Standing - Level of Assistance: 4: Min assist Static Standing - Comment/# of Minutes: ~5 min Rhomberg - Eyes Opened: 30  (sec, 2x ) Rhomberg - Eyes Closed: 15  (sec, 2x) Dynamic Standing Balance Dynamic Standing - Balance Support: No upper extremity supported (RW prn) Dynamic Standing - Level of Assistance: 4: Min assist Dynamic Standing - Balance Activities: Reaching for objects Exercise  General Exercises - Lower Extremity Long Arc Quad: AROM;Strengthening;Both;20 reps;Seated (manually resisted) End of Session PT - End of Session Equipment Utilized During Treatment: Gait belt Activity Tolerance: Patient tolerated treatment well Patient left: with call bell in reach;in chair Nurse Communication: Mobility status for transfers;Mobility status for ambulation General Behavior During Session: [facility] for tasks performed Cognition: Impaired Cognitive Impairment: short term memory deficits   Sherie Don 04/18/2011, 10:02 AM  Sherie Don) Carleene Mains PT, DPT Acute Rehabilitation [phone]

## 2011-04-18 NOTE — Progress Notes (Signed)
ANTICOAGULATION CONSULT NOTE - Follow Up Consult  Pharmacy Consult for Coumadin Indication: Hx DVT  Allergies  Allergen Reactions  . Penicillins     Unknown reaction.    Patient Measurements: Height: 5\' 3"  (160 cm) Weight: 126 lb 5.2 oz (57.3 kg) IBW/kg (Calculated) : 52.4  Adjusted Body Weight:   Vital Signs: Temp: 98.1 F (36.7 C) (11/19 0555) BP: 129/65 mmHg (11/19 0555) Pulse Rate: 56  (11/19 0555)  Labs:  Basename 04/18/11 0520 04/17/11 0730 04/16/11 0500  HGB -- -- 12.2  HCT -- -- 36.7  PLT -- -- 130*  APTT -- -- --  LABPROT 21.5* 20.7* 20.9*  INR 1.83* 1.74* 1.77*  HEPARINUNFRC -- -- --  CREATININE -- -- 0.86  CKTOTAL -- -- --  CKMB -- -- --  TROPONINI -- -- --   Estimated Creatinine Clearance: 35.2 ml/min (by C-G formula based on Cr of 0.86).   Assessment: 89yof on Coumadin for h/o DVT. INR (1.83) is subtherapeutic but trending up.  - No CBC since 11/17 - No bleeding reported  Goal of Therapy:  INR 2-3   Plan:  1. Coumadin 5mg  po x 1 2. Follow-up AM INR and discharge plans  Cleon Dew [phone] 04/18/2011,10:02 AM

## 2011-04-18 NOTE — Progress Notes (Signed)
Rondel Oh to be D/C'd [facility] per MD order.  Discussed with the patient and all questions fully answered.  Tamisha, [employer]  Home Medication Instructions ZOX:[id number]   Printed on:04/18/11 1525  Medication Information                    levothyroxine (SYNTHROID, LEVOTHROID) 25 MCG tablet Take 25 mcg by mouth every morning.             alendronate (FOSAMAX) 70 MG tablet Take 70 mg by mouth every 7 (seven) days. Take with a full glass of water on an empty stomach.            ENSURE (ENSURE) Take 1 Can by mouth daily.             calcium-vitamin D (OSCAL WITH D) 500-200 MG-UNIT per tablet Take 1 tablet by mouth daily.             warfarin (COUMADIN) 4 MG tablet Take 4 mg by mouth every other day.             warfarin (COUMADIN) 5 MG tablet Take 5 mg by mouth every other day.             acetaminophen (TYLENOL) 500 MG tablet Take 500 mg by mouth 3 (three) times daily.             rosuvastatin (CRESTOR) 20 MG tablet Take 20 mg by mouth daily.             sertraline (ZOLOFT) 50 MG tablet Take 50 mg by mouth daily.             polyethylene glycol (MIRALAX / GLYCOLAX) packet Take 17 g by mouth daily as needed. For constipation            ciprofloxacin (CIPRO) 750 MG tablet Take 1 tablet (750 mg total) by mouth 2 (two) times daily.             VVS, Skin clean, dry and intact without evidence of skin break down, no evidence of skin tears noted. IV catheter discontinued intact. Site without signs and symptoms of complications. Dressing and pressure applied. Pt was discharged with all belongings.   An After Visit Summary was printed and explained to the patient.  Copies of after visit summary were given to PTAR who came to transfer patient to the assisted living facility.  Patient escorted via stretcher and transport by PTAR.  Effie Berkshire 04/18/2011 3:25 PM

## 2011-04-18 NOTE — Plan of Care (Signed)
Problem: Phase III Progression Outcomes Goal: Pain controlled on oral analgesia Outcome: Not Applicable Date Met:  04/18/11 Pt has not requested pain medication, K-pad is effective for lower back discomfort Goal: Activity at appropriate level-compared to baseline (UP IN CHAIR FOR HEMODIALYSIS)  Outcome: Completed/Met Date Met:  04/18/11 Pt uses front wheel walker appropriately, and is eager to get up and ambulate. Goal: Afebrile, Vital Signs remain stable Outcome: Completed/Met Date Met:  04/18/11 Filed Vitals:    04/18/11 0555  BP: 129/65  Pulse: 56  Temp: 98.1 F (36.7 C)  Resp: 18       Problem: Discharge Progression Outcomes Goal: Pain controlled with appropriate interventions Outcome: Completed/Met Date Met:  04/18/11 K-pad

## 2011-04-18 NOTE — Progress Notes (Signed)
Clinical Social Worker received phone call from [facility] who stated that pt can return to ALF. Clinical Social Worker notified MD. Clinical Social Worker to facilitate pt D/C needs.  Jacklynn Lewis, MSW, LCSWA  Clinical Social Work [phone]

## 2011-04-18 NOTE — Progress Notes (Signed)
Clinical Social Worker contacted [facility] to discuss pt return. [facility] requested further information in regard to pt and stated that a liaison from [facility] would come to assess patient at 12 pm to determine if pt able to return. Clinical Social Worker faxed facility the information that was requested and will follow-up with [facility] after the assessment. Clinical Social Worker to facilitate pt D/C needs.  Jacklynn Lewis, MSW, LCSWA  Clinical Social Work [phone]

## 2011-04-18 NOTE — Discharge Summary (Signed)
Addendum  Patient seen and examined, chart and data base reviewed.  I agree with the above assessment and plan  For full details please see Mrs. Algis Downs PA. Note.   E. coli bacteremia likely from a UTI, patient to complete a total 14 days of antibiotic, followup with primary care physician.  Takako Minckler A  04/18/2011, 1:10 PM

## 2011-04-18 NOTE — Progress Notes (Signed)
Clinical Social Worker facilitated pt D/C needs including contacting facility and arranging transport to [facility] via ambulance. Pt did not identify any individuals for this CSW to contact at D/C. No further CSW needs at this time.  Jacklynn Lewis, MSW, [facility]  Clinical Social Work [phone]   14:48

## 2011-04-18 NOTE — Progress Notes (Signed)
Spoke with patient about HHC. She chose [facility] from [facility] list. Contacted  Debbie at [facility] and set up HHPT, HHOT and rolling walker. Dan Humphreys will be delivered to patient's home at [employer]. Request entered in [facility]. Copy of H and P, d/c summary, demographics, orders for [facility], walker and face to face given to [place] at [facility].

## 2011-04-18 NOTE — Discharge Summary (Addendum)
Patient ID: Rebecca Dawson MRN: [id number] DOB/AGE: 75/11/1917 75 y.o.  Admit date: 04/13/2011 Discharge date: 04/18/2011  Primary Care Physician:  Ginette Otto, MD, MD  Discharge Diagnoses:   Present on Admission  Active Problems:  UTI (lower urinary tract infection)  Back pain:  Secondary to  Compression fracture of L3 lumbar vertebra  Bradycardia  Depression over the death of her husband April 23, 2011.  Secondary Dx:  Hypothyroidism  Dementia - mild  history of DVT (deep venous thrombosis)  Emphysema  Thrombocytopenia    Current Discharge Medication List    NEW MEDICATION:  CIPROFLOXICIN 750mg  Take 1 pill by mouth twice a day for 9 days.      CONTINUE these medications which have NOT CHANGED   Details  acetaminophen (TYLENOL) 500 MG tablet Take 500 mg by mouth 3 (three) times daily.      alendronate (FOSAMAX) 70 MG tablet Take 70 mg by mouth every 7 (seven) days. Take with a full glass of water on an empty stomach.     calcium-vitamin D (OSCAL WITH D) 500-200 MG-UNIT per tablet Take 1 tablet by mouth daily.      ENSURE (ENSURE) Take 1 Can by mouth daily.      levothyroxine (SYNTHROID, LEVOTHROID) 25 MCG tablet Take 25 mcg by mouth every morning.      polyethylene glycol (MIRALAX / GLYCOLAX) packet Take 17 g by mouth daily as needed. For constipation     rosuvastatin (CRESTOR) 20 MG tablet Take 20 mg by mouth daily.      sertraline (ZOLOFT) 50 MG tablet Take 50 mg by mouth daily.      !! warfarin (COUMADIN) 4 MG tablet Take 4 mg by mouth every other day.      !! warfarin (COUMADIN) 5 MG tablet Take 5 mg by mouth every other day.       !! - Potential duplicate medications found. Please discuss with provider.    STOP taking these medications     acetaminophen-codeine (TYLENOL #3) 300-30 MG per tablet      docusate sodium (COLACE) 100 MG capsule      donepezil (ARICEPT) 10 MG tablet      OxyCODONE HCl (OXYCONTIN PO)         Consults:  1.  Dr.  Marko Stai, Psychiatry   2.  Dr. Rollene Rotunda, Cardiology  Significant Diagnostic Studies:  Ct Abdomen Pelvis Wo Contrast  04/14/2011  *RADIOLOGY REPORT*  Clinical Data: 77 year old female with back pain, urinary tract infection, fevers.  Query hydronephrosis or urologic stone.  CT ABDOMEN AND PELVIS WITHOUT CONTRAST  Technique:  Multidetector CT imaging of the abdomen and pelvis was performed following the standard protocol without intravenous contrast.  Comparison: 03/05/2008.    IMPRESSION:  1.  Mild to moderate L3 compression fracture which is new since 2009 and may be acute.  If specific therapy such as vertebroplasty is desired a nuclear medicine bone scan or MRI would best evaluate chronicity. 2.  Mild perinephric stranding greater on the left, this could be age related, related to acute renal insufficiency, or less likely renal infection.  Bilateral nephrolithiasis but no evidence of acute obstructive uropathy. 3.  No other acute findings in the abdomen or pelvis.   Dg Chest Port 1 View  04/13/2011  *RADIOLOGY REPORT*  Clinical Data: Fever.  PORTABLE CHEST - 1 VIEW  Comparison: 07/01/2010.  Findings: The heart is enlarged.  Chronic emphysematous changes are again noted.  No focal airspace disease is present.  There  is no edema or effusion to suggest failure.  The visualized soft tissues and bony thorax are unremarkable.  IMPRESSION:  1.  No acute cardiopulmonary disease or significant interval change. 2.  Stable cardiomegaly without failure. 3.  Emphysema.  Original Report Authenticated By: Jamesetta Orleans. MATTERN, M.D.    Brief H and P: From the admission note:  77 year old female with H/O DVT on coumadin, dementia chronic pain was brought to ER because of fever and chills for one day. Patient in the ER was found to be febrile with temp of 101. Urine has features of UTI. In addition patient has urinary retention for which foley catheter was placed. Pateint has been complaining of back  pain and is directing her hands to flanks. Denies any chest pain or shortness of breath or cough. Denies anu nausea or vomiting or diarrhea.  Will admit to telemetry.  Continue ceftriaxone for UTI. Follow cultures both blood and urine which has been already sent from ER.  Patient is complaining of back pain and has urinary retention. Reviewing her chart patient previously did have hydronephrosis. Will get CT abdomen and pelvis. Patient has h/o aortic stenosis and cardiomyopathy so will be gentle in hydration.  Coumadin per pharmacy.  Hospital Course:  Principal Problem:  *Fever:  The patient's fever resolved quickly with antibiotic therapy.  It was felt to be due to UTI and E-Coli Bacteremia.  Blood cultures were positive for E-Coli which was pan-sensitive, and treated with Rocephin as well.  As of today the day of discharge the patient has had 5 days of IV Rocephin.  We will discharge her on 9 more days of Cipro 750 mg po bid, and request that she follow up with Dr. Pete Glatter on 11/21.  The patient had episodes of bradycardia.  The lowest pulse rates where in the 37 - 40 range primarily when the patient was asleep.  Her Aricept was discontinued, but her pulse rate remained low cardiology was consulted and the patient was seen by Dr. Antoine Poche.  Cardiology noted that the patient's bradycardia persisted despite the discontinuation of Aricept and narcotic pain medicines. TSH was within normal limits a 2-D echocardiogram was completed.  Results indicated an LVEF of approximately 30% with diffuse hypokinesis aortic valve is significantly thickened with mild aortic stenosis. Mitral valve demonstrated a calcified annulus and mild regurgitation.  The patient EKG in February of 2012 demonstrated normal sinus rhythm with a pulse rate of 61 left bundle branch block. EKG done 04/15/2011 showed no significant changes. Dr. Ivette Loyal lines are assessment was that although the patient was bradycardiac she had no recent  symptoms related to this no syncope or presyncope no shortness of breath PND or or orthopnea he felt that there was no indication for pacing or change in medicine therapy.  On the day of discharge the patient's pulse rates range from 40 while she's sleeping to the low 60s during waking hours she is in normal sinus rhythm.  L3 compression fracture was demonstrated on CT. This is new since 2009 and may be acute.  The patient did not require any narcotic pain medication. Rather she was made comfortable and warm with a heating pad on her back. As the patient is being discharged today we strongly recommend that she not be put back on any narcotic medication as these could significantly worse and her bradycardia and possibly her mental status the patient seems to do well with simply a heating pad on her back  Throughout her hospitalization  the patient demonstrated severe depression over the death of her husband Ernie 3 weeks ago. Because her depression was so severe and she expressed wanting to be with her husband, a psychiatric consultation was called to help her get through her grief. The psychiatrist restarted her Zoloft. Further the patient was seen by psychiatric social work. Who obtained a palliative care consultation. So that she could be followed as an outpatient by palliative care for support and counseling further the patient's assisted-living facility [facility] would has their own program to help patient still with grief and counseling the patient will be enrolled in the [facility] program as well.  Secondary diagnoses include:   Hypothyroidism;  Dementia - mild;  history of DVT (deep venous thrombosis);  Emphysema; thrombocytopenia.  These were quiet with no active changes during this hospitalization.       Physical Exam on Discharge:  Filed Vitals:   04/17/11 0508 04/17/11 1500 04/17/11 2250 04/18/11 0555  BP: 157/71 131/68 163/73 129/65  Pulse: 72 52 56 56  Temp: 97.9 F (36.6 C) 98.4 F (36.9  C) 97.3 F (36.3 C) 98.1 F (36.7 C)  TempSrc:      Resp: 18 19 19 18   Height:      Weight:      SpO2: 95% 97% 98% 91%     Intake/Output Summary (Last 24 hours) at 04/18/11 1157 Last data filed at 04/18/11 0900  Gross per 24 hour  Intake    420 ml  Output    251 ml  Net    169 ml    General: Alert, awake, oriented x3, easily starts to cry. HEENT: No bruits, no goiter. Heart: Regular rate and rhythm, without murmurs, rubs, gallops. Lungs: Clear to auscultation bilaterally. Abdomen: Soft, nontender, nondistended, positive bowel sounds. Extremities: No clubbing cyanosis or edema with positive pedal pulses. Neuro: Grossly intact, nonfocal.  CBC:    Component Value Date/Time   WBC 5.1 04/16/2011 0500   HGB 12.2 04/16/2011 0500   HCT 36.7 04/16/2011 0500   PLT 130* 04/16/2011 0500   MCV 96.3 04/16/2011 0500   NEUTROABS 4.9 04/13/2011 1829   LYMPHSABS 0.2* 04/13/2011 1829   MONOABS 0.1 04/13/2011 1829   EOSABS 0.0 04/13/2011 1829   BASOSABS 0.0 04/13/2011 1829    Basic Metabolic Panel:    Component Value Date/Time   NA 141 04/16/2011 0500   K 3.7 04/16/2011 0500   CL 110 04/16/2011 0500   CO2 22 04/16/2011 0500   BUN 12 04/16/2011 0500   CREATININE 0.86 04/16/2011 0500   GLUCOSE 96 04/16/2011 0500   CALCIUM 9.1 04/16/2011 0500     Disposition and Follow-up: The patient is stable for discharge to assisted living facility. She has followup scheduled with Dr. Merlene Laughter on Wednesday, November 21 at 8:30 AM. This will include a hospital followup for UTI and bacteremia,  as well as an INR check.  Time spent on Discharge:  60 minutes  Signed: Algis Downs L 04/18/2011, 11:57 AM

## 2011-04-18 NOTE — Progress Notes (Signed)
Clinical Social Work Psychiatry  Assessment    Presenting Symptoms/Problems: patient presents with depression and sadness over a recent loss of her husband and fear she will not be able to return home at discharge.   Psychiatric History: No past psych history noted, or reported per patient or friend: Adela Lank.  Patient was restarted on her Zoloft for depression.     Family Collateral Information: Patient friend at the Assisted Living Adela Lank, was contacted and reports no safety concerns at this time.  Patient has been dealing with off and on depression, no reports of SI, plan, thoughts, or intent per friend report.  Per Adela Lank, patient usually snaps out of depression and does well when she comes back to her ALF.  Adela Lank reports once she will be back in her routine and back at home she will be much better off.                              Emotional Health/Current Symptoms:     Suicide/Self harm: None reported per patient or friends.  Currently depressed, but no intent to hurt self or plan.    Psychotic/Dissociative Symptoms: No reported (auditory or visual hallucinations) Not internally preoccupied, logical and goal directed.        Attention/Behavioral Symptoms:  Mood: Depression Sadness Affect:  Depressed and Tearful Thought Content: WNL  Alert and oriented x4 and able to give remote and recent history.     Recent Loss/Stressor:  Loss of husband on April 05 2011.  Patient also has a son who lives very far away and a daughter who she does not get to see/ speak to regularly due to estrangement.      Substance Abuse/Use: None reported. No recent or remote history of SA.        Disposition/Interpretive Summary:   CSW spoke with patient at bedside, patient very tearful and upset reporting she just wanted to go home and was afraid the hospital was going to send her somewhere else.  CSW explained the process and making sure all of her needs were going to be met at the ALF.  Patient  understand, but is very tearful and worried, and wanting to go home.  Plan is for her to return to ALF this afternoon pending an evaluation by facility.  CSW will give patient follow up information for hospice/pallative support and grief groups.  Per friend Adela Lank, there are also support groups at the ALF for people who have lost a spouse or family member.  Patient was also resumed on her Zoloft and reports no side effects.  No current needs from Psych CSW, thus will sign off.   Ashley Jacobs, MSW LCSWA [phone]

## 2011-04-19 LAB — CULTURE, BLOOD (ROUTINE X 2)
Culture  Setup Time: 201211142251
Culture: NO GROWTH

## 2011-04-19 NOTE — Discharge Summary (Signed)
Addendum  Patient seen and examined, chart and data base reviewed.  I agree with the above assessment and plan  For full details please see Mrs. Algis Downs PA. Note.  Escherichia coli bacteremia, bradycardia, acute grief reaction and lower back pain secondary to L3 compression fracture  Discharge to assisted living facility, to follow up with Dr. Merlene Laughter

## 2012-08-15 ENCOUNTER — Encounter (HOSPITAL_COMMUNITY): Payer: Self-pay

## 2012-08-15 ENCOUNTER — Emergency Department (HOSPITAL_COMMUNITY)
Admission: EM | Admit: 2012-08-15 | Discharge: 2012-08-15 | Disposition: A | Discharge status: Skilled Nursing Facility | Payer: Medicare Other | Attending: Emergency Medicine | Admitting: Emergency Medicine

## 2012-08-15 ENCOUNTER — Emergency Department (HOSPITAL_COMMUNITY): Payer: Medicare Other

## 2012-08-15 DIAGNOSIS — Z8719 Personal history of other diseases of the digestive system: Secondary | ICD-10-CM | POA: Insufficient documentation

## 2012-08-15 DIAGNOSIS — Z79899 Other long term (current) drug therapy: Secondary | ICD-10-CM | POA: Insufficient documentation

## 2012-08-15 DIAGNOSIS — R0789 Other chest pain: Secondary | ICD-10-CM

## 2012-08-15 DIAGNOSIS — R0602 Shortness of breath: Secondary | ICD-10-CM | POA: Insufficient documentation

## 2012-08-15 DIAGNOSIS — Z86718 Personal history of other venous thrombosis and embolism: Secondary | ICD-10-CM | POA: Insufficient documentation

## 2012-08-15 DIAGNOSIS — F0390 Unspecified dementia without behavioral disturbance: Secondary | ICD-10-CM | POA: Insufficient documentation

## 2012-08-15 DIAGNOSIS — Z8669 Personal history of other diseases of the nervous system and sense organs: Secondary | ICD-10-CM | POA: Insufficient documentation

## 2012-08-15 DIAGNOSIS — R071 Chest pain on breathing: Secondary | ICD-10-CM | POA: Insufficient documentation

## 2012-08-15 DIAGNOSIS — I10 Essential (primary) hypertension: Secondary | ICD-10-CM | POA: Insufficient documentation

## 2012-08-15 DIAGNOSIS — Z7901 Long term (current) use of anticoagulants: Secondary | ICD-10-CM | POA: Insufficient documentation

## 2012-08-15 LAB — COMPREHENSIVE METABOLIC PANEL
Albumin: 3.7 g/dL (ref 3.5–5.2)
BUN: 19 mg/dL (ref 6–23)
Calcium: 9.8 mg/dL (ref 8.4–10.5)
GFR calc Af Amer: 58 mL/min — ABNORMAL LOW (ref >90)
Glucose, Bld: 99 mg/dL (ref 70–99)
Potassium: 4.1 mEq/L (ref 3.5–5.1)
Sodium: 137 mEq/L (ref 135–145)
Total Protein: 6.8 g/dL (ref 6.0–8.3)

## 2012-08-15 LAB — CBC WITH DIFFERENTIAL/PLATELET
Basophils Relative: 0 % (ref 0–1)
Eosinophils Absolute: 0.1 10*3/uL (ref 0.0–0.7)
Eosinophils Relative: 1 % (ref 0–5)
Lymphs Abs: 1.2 10*3/uL (ref 0.7–4.0)
MCH: 33.4 pg (ref 26.0–34.0)
MCHC: 34.7 g/dL (ref 30.0–36.0)
MCV: 96.2 fL (ref 78.0–100.0)
Monocytes Relative: 12 % (ref 3–12)
Neutrophils Relative %: 65 % (ref 43–77)
Platelets: 190 10*3/uL (ref 150–400)
RBC: 4.22 MIL/uL (ref 3.87–5.11)

## 2012-08-15 LAB — URINALYSIS, ROUTINE W REFLEX MICROSCOPIC
Ketones, ur: NEGATIVE mg/dL
Leukocytes, UA: NEGATIVE
Nitrite: NEGATIVE
Protein, ur: NEGATIVE mg/dL
Urobilinogen, UA: 0.2 mg/dL (ref 0.0–1.0)

## 2012-08-15 LAB — PROTIME-INR: INR: 2.04 — ABNORMAL HIGH (ref 0.00–1.49)

## 2012-08-15 LAB — TROPONIN I: Troponin I: <0.3 ng/mL (ref <0.30)

## 2012-08-15 MED ORDER — IOHEXOL 350 MG/ML SOLN
80.0000 mL | Freq: Once | INTRAVENOUS | Status: AC | PRN
  Administered 2012-08-15: 80 mL via INTRAVENOUS

## 2012-08-15 MED ORDER — MORPHINE SULFATE 2 MG/ML IJ SOLN
2.0000 mg | Freq: Once | INTRAMUSCULAR | Status: AC
  Administered 2012-08-15: 2 mg via INTRAVENOUS
  Filled 2012-08-15: qty 1

## 2012-08-15 MED ORDER — IOHEXOL 300 MG/ML  SOLN
80.0000 mL | Freq: Once | INTRAMUSCULAR | Status: AC | PRN
  Administered 2012-08-15: 1 mL via INTRAVENOUS

## 2012-08-15 MED ORDER — HYDROCODONE-ACETAMINOPHEN 5-325 MG PO TABS: 1.0000 | ORAL_TABLET | ORAL | Status: DC | PRN

## 2012-08-15 MED ORDER — ONDANSETRON HCL 4 MG/2ML IJ SOLN
4.0000 mg | Freq: Once | INTRAMUSCULAR | Status: AC
  Administered 2012-08-15: 4 mg via INTRAVENOUS
  Filled 2012-08-15: qty 2

## 2012-08-15 NOTE — ED Notes (Signed)
Report given to [facility].

## 2012-08-15 NOTE — ED Notes (Signed)
Pt. Reports having chest pain over 3 days.  Under her lt. Breast, Pt. Reports that the pain is always there but increases in intensity.  Pt. Was seen at her Doctors office  Last Friday and was diagnosed for constipation and given directions for an enema.  Pt. Is alert and oriented X4.  Skin is w/d/p.

## 2012-08-15 NOTE — ED Notes (Signed)
Patient moved to pod c to await transport back to [facility]. ptr has been called by previous nurse and report called to [facility] by greg

## 2012-08-15 NOTE — ED Notes (Signed)
Patient ambulated in room with minimal assistance and used bedside commode.  Patient stated she feels better and her pulse oc 96% room air and then at rest pulse ox increased to 100% room air.

## 2012-08-15 NOTE — ED Notes (Signed)
Called [facility] for transport to [facility].

## 2012-08-15 NOTE — ED Notes (Signed)
Pt. Has a sharp pain under her lt. Breast around her rib area.  When palpated pt. Jumps.  Pt.'s oxygen sats are 93% on RA. Placed pt. On oxygen 2l.  sats increased to 100%  A deep breath increases the pain

## 2012-08-15 NOTE — ED Provider Notes (Signed)
History     CSN: [id number]  Arrival date & time 08/15/12  0806   First MD Initiated Contact with Patient 08/15/12 [phone]      Chief Complaint  Patient presents with  . Chest Pain    (Consider location/radiation/quality/duration/timing/severity/associated sxs/prior treatment) HPI Comments: Patient has history of dementia presenting with left-sided chest pain has been constant for the past week. It is worse with palpation and worse with deep breathing. She states she has not had pain like this in the past. She endorses some shortness of breath because of pain. Denies any cough or fever. She denies abdominal pain, nausea or vomiting. She denies any rash. She has a history of DVT with IVC filter in place. She denies any cough, congestion, leg pain or swelling.  The history is provided by the patient and the EMS personnel.    Past Medical History  Diagnosis Date  . Dementia   . Hypertension   . GI bleed   . DVT (deep vein thrombosis) in pregnancy   . Dizziness     Past Surgical History  Procedure Laterality Date  . Abdominal hysterectomy      Family History  Problem Relation Age of Onset  . Hypertension Other     History  Substance Use Topics  . Smoking status: Never Smoker   . Smokeless tobacco: Not on file  . Alcohol Use: No    OB History   Grav Para Term Preterm Abortions TAB SAB Ect Mult Living                  Review of Systems  Constitutional: Negative for fever, activity change and appetite change.  Eyes: Negative for visual disturbance.  Respiratory: Positive for shortness of breath. Negative for cough.   Cardiovascular: Positive for chest pain.  Gastrointestinal: Negative for nausea, vomiting and abdominal pain.  Genitourinary: Negative for dysuria, hematuria, vaginal bleeding and vaginal discharge.  Musculoskeletal: Negative for back pain.  Skin: Negative for rash.  Neurological: Negative for dizziness and headaches.  A complete 10 system review of  systems was obtained and all systems are negative except as noted in the HPI and PMH.    Allergies  Penicillins  Home Medications   Current Outpatient Rx  Name  Route  Sig  Dispense  Refill  . acetaminophen (TYLENOL) 500 MG tablet   Oral   Take 500 mg by mouth 3 (three) times daily as needed for pain (takes three times a day and as needed).          Marland Kitchen alendronate (FOSAMAX) 70 MG tablet   Oral   Take 70 mg by mouth every 7 (seven) days. Saturday. Take with a full glass of water on an empty stomach.         . calcium-vitamin D (OSCAL WITH D) 500-200 MG-UNIT per tablet   Oral   Take 1 tablet by mouth daily.           Marland Kitchen donepezil (ARICEPT) 10 MG tablet   Oral   Take 10 mg by mouth at bedtime.         . ENSURE (ENSURE)   Oral   Take 1 Can by mouth daily. chocolate         . levothyroxine (SYNTHROID, LEVOTHROID) 25 MCG tablet   Oral   Take 25 mcg by mouth every morning.           . Multiple Vitamins-Minerals (ICAPS AREDS FORMULA PO)   Oral   Take 1  capsule by mouth 2 (two) times daily.         . polyethylene glycol (MIRALAX / GLYCOLAX) packet   Oral   Take 17 g by mouth daily as needed (for constipation). For constipation         . sertraline (ZOLOFT) 50 MG tablet   Oral   Take 75 mg by mouth daily.          . traMADol (ULTRAM) 50 MG tablet   Oral   Take 50 mg by mouth every 6 (six) hours as needed for pain.         Marland Kitchen warfarin (COUMADIN) 3 MG tablet   Oral   Take 3 mg by mouth daily.           BP 170/67  Pulse 60  Temp(Src) 97.6 F (36.4 C)  Resp 16  SpO2 95%  Physical Exam  Constitutional: She is oriented to person, place, and time. She appears well-developed and well-nourished. No distress.  HENT:  Head: Normocephalic and atraumatic.  Mouth/Throat: Oropharynx is clear and moist.  Eyes: Conjunctivae and EOM are normal. Pupils are equal, round, and reactive to light.  Neck: Normal range of motion. Neck supple.  Cardiovascular:  Normal rate, regular rhythm and normal heart sounds.   No murmur heard. Pulmonary/Chest: Effort normal and breath sounds normal. No respiratory distress. She exhibits tenderness.  L chest wall tenderness under left breast.  Reproducible and exuisitely tender.  No rash.  Abdominal: Soft. There is no tenderness. There is no rebound and no guarding.  Musculoskeletal: Normal range of motion. She exhibits no edema and no tenderness.  Neurological: She is alert and oriented to person, place, and time. No cranial nerve deficit. She exhibits normal muscle tone. Coordination normal.  Skin: Skin is warm.    ED Course  Procedures (including critical care time)  Labs Reviewed  COMPREHENSIVE METABOLIC PANEL - Abnormal; Notable for the following:    GFR calc non Af Amer 50 (*)    GFR calc Af Amer 58 (*)    All other components within normal limits  PROTIME-INR - Abnormal; Notable for the following:    Prothrombin Time 22.2 (*)    INR 2.04 (*)    All other components within normal limits  CBC WITH DIFFERENTIAL  TROPONIN I  URINALYSIS, ROUTINE W REFLEX MICROSCOPIC   Dg Chest 2 View  08/15/2012  *RADIOLOGY REPORT*  Clinical Data: Chest pain.  Weakness.  CHEST - 2 VIEW  Comparison: 04/13/2011 and 07/01/2010.  Findings: Cardiomegaly.  Calcified tortuous aorta.  Central pulmonary vascular prominence. Minimal peribronchial thickening stable.  Scarring left mid to lower lung zone.  No segmental consolidation or pneumothorax.  Right shoulder joint degenerative changes.  Possible avascular necrosis femoral heads.  Thoracic kyphosis with degenerative changes.  IMPRESSION: Cardiomegaly.  Calcified tortuous aorta.  Central pulmonary vascular prominence.   Original Report Authenticated By: Lacy Duverney, M.D.    Ct Angio Chest Pe W/cm &/or Wo Cm  08/15/2012  *RADIOLOGY REPORT*  Clinical Data: Left chest pain for 3 days. Shortness of breath. History of deep venous thrombosis.  CT ANGIOGRAPHY CHEST  Technique:   Multidetector CT imaging of the chest using the standard protocol during bolus administration of intravenous contrast. Multiplanar reconstructed images including MIPs were obtained and reviewed to evaluate the vascular anatomy.  Contrast: 1mL OMNIPAQUE IOHEXOL 300 MG/ML  SOLN, 80mL OMNIPAQUE IOHEXOL 350 MG/ML SOLN  Comparison: 08/15/2012 chest x-ray.  01/27/2010 chest CT.  Findings: Artifact versus small filling defect  superior margin of the superior segment of the right lower lobe pulmonary artery. If this is a true finding, this may be related to a remote pulmonary embolus with scarring although new compared to the 2011 exam.  No other findings of pulmonary emboli.  Cardiomegaly.  Mitral valve and aortic valve calcifications. Coronary artery calcifications.  Atherosclerotic type changes of the thoracic aorta with dilated ascending thoracic aorta measuring up to 4 cm without significant change.  No obvious dissection.  Aberrant right subclavian artery once again noted.  Narrowed left subclavian artery.  Basilar subsegmental atelectatic changes bilaterally.  Trachea and mainstem bronchi are patent.  Septated liver cyst.  Calcified splenic granulomas.  No free air upper abdomen.  Thoracic kyphosis with degenerative changes and mild loss of height mid thoracic vertebra unchanged.  IMPRESSION: Artifact versus small filling defect superior margin of the superior segment of the right lower lobe pulmonary artery. If this is a true finding, this may be related to a remote pulmonary embolus with scarring although new compared to the 2011 exam.  No other findings of pulmonary emboli.  Dilated thoracic aorta without significant change. No obvious dissection.  Cardiomegaly with mitral valve, aortic valve and prominent coronary artery calcifications.  Basilar subsegmental atelectasis.   Original Report Authenticated By: Lacy Duverney, M.D.      No diagnosis found.    MDM  Reproducible left-sided chest pain for the past  week without cough or fever.  no rash to suggest zoster.  INR therapeutic. Doubt PE. No pneumonia or pneumothorax. There is artifact versus small subacute PE in the right lower lobe. Patient's INR is therapeutic at 2. She's not had any hypoxia or tachypnea or tachycardia.  Patient ambulatory without desaturation and maintains saturation >96%.  Pain has improved. Chest pain is reproducible to palpation and atypical for ACS, PE, or dissection.  PAtient counseled to observe for possible rash development as this may represent early zoster. Counseled to return for worsening pain, SOB, or any other concerns.   Date: 08/15/2012  Rate: 54  Rhythm: normal sinus rhythm  QRS Axis: left  Intervals: normal  ST/T Wave abnormalities: nonspecific ST/T changes  Conduction Disutrbances:left bundle branch block  Narrative Interpretation:   Old EKG Reviewed: unchanged       Glynn Octave, MD 08/15/12 1758

## 2013-04-21 ENCOUNTER — Emergency Department (HOSPITAL_COMMUNITY)
Admission: EM | Admit: 2013-04-21 | Discharge: 2013-04-21 | Disposition: A | Discharge status: Home / Self Care | Payer: Medicare Other | Attending: Emergency Medicine | Admitting: Emergency Medicine

## 2013-04-21 ENCOUNTER — Encounter (HOSPITAL_COMMUNITY): Payer: Self-pay | Admitting: Emergency Medicine

## 2013-04-21 ENCOUNTER — Emergency Department (HOSPITAL_COMMUNITY): Payer: Medicare Other

## 2013-04-21 DIAGNOSIS — Z7982 Long term (current) use of aspirin: Secondary | ICD-10-CM | POA: Insufficient documentation

## 2013-04-21 DIAGNOSIS — Z79899 Other long term (current) drug therapy: Secondary | ICD-10-CM | POA: Insufficient documentation

## 2013-04-21 DIAGNOSIS — R5381 Other malaise: Secondary | ICD-10-CM | POA: Insufficient documentation

## 2013-04-21 DIAGNOSIS — Z880 Allergy status to penicillin: Secondary | ICD-10-CM | POA: Insufficient documentation

## 2013-04-21 DIAGNOSIS — K922 Gastrointestinal hemorrhage, unspecified: Secondary | ICD-10-CM | POA: Insufficient documentation

## 2013-04-21 DIAGNOSIS — F0390 Unspecified dementia without behavioral disturbance: Secondary | ICD-10-CM | POA: Insufficient documentation

## 2013-04-21 DIAGNOSIS — I10 Essential (primary) hypertension: Secondary | ICD-10-CM | POA: Insufficient documentation

## 2013-04-21 DIAGNOSIS — R531 Weakness: Secondary | ICD-10-CM

## 2013-04-21 DIAGNOSIS — Z792 Long term (current) use of antibiotics: Secondary | ICD-10-CM | POA: Insufficient documentation

## 2013-04-21 DIAGNOSIS — D649 Anemia, unspecified: Secondary | ICD-10-CM | POA: Insufficient documentation

## 2013-04-21 HISTORY — DX: Left bundle-branch block, unspecified: I44.7

## 2013-04-21 LAB — CBC WITH DIFFERENTIAL/PLATELET
Basophils Relative: 0 % (ref 0–1)
Eosinophils Absolute: 0.1 10*3/uL (ref 0.0–0.7)
Eosinophils Relative: 2 % (ref 0–5)
Lymphs Abs: 1.2 10*3/uL (ref 0.7–4.0)
MCH: 33.1 pg (ref 26.0–34.0)
MCHC: 33.1 g/dL (ref 30.0–36.0)
MCV: 100 fL (ref 78.0–100.0)
Neutrophils Relative %: 72 % (ref 43–77)
Platelets: 180 10*3/uL (ref 150–400)
RBC: 3.17 MIL/uL — ABNORMAL LOW (ref 3.87–5.11)
RDW: 13.5 % (ref 11.5–15.5)

## 2013-04-21 LAB — COMPREHENSIVE METABOLIC PANEL
ALT: 17 U/L (ref 0–35)
Albumin: 3.1 g/dL — ABNORMAL LOW (ref 3.5–5.2)
BUN: 22 mg/dL (ref 6–23)
Calcium: 9.3 mg/dL (ref 8.4–10.5)
GFR calc Af Amer: 52 mL/min — ABNORMAL LOW (ref >90)
Glucose, Bld: 79 mg/dL (ref 70–99)
Potassium: 4.5 mEq/L (ref 3.5–5.1)
Sodium: 141 mEq/L (ref 135–145)
Total Protein: 6.3 g/dL (ref 6.0–8.3)

## 2013-04-21 LAB — URINALYSIS, ROUTINE W REFLEX MICROSCOPIC
Glucose, UA: NEGATIVE mg/dL
Leukocytes, UA: NEGATIVE
Nitrite: NEGATIVE
Specific Gravity, Urine: 1.021 (ref 1.005–1.030)
pH: 7 (ref 5.0–8.0)

## 2013-04-21 LAB — OCCULT BLOOD, POC DEVICE: Fecal Occult Bld: POSITIVE — AB

## 2013-04-21 LAB — POCT I-STAT TROPONIN I

## 2013-04-21 NOTE — ED Notes (Signed)
Per EMS, pt coming from [facility] at [facility] assisted living facility. Per staff at assisted living, pt normally ambulatory and independent. Woke up this morning with generalized weakness, had to have assistance getting up. Facility staff report to EMS pt just "not acting normal." Denies pain or any other problems, just feels weak.

## 2013-04-21 NOTE — ED Notes (Signed)
[facility] has arrived to transport pt back to [facility]

## 2013-04-21 NOTE — ED Notes (Signed)
Called [facility]. [phone] and requested transportation for patient back to assisted living.spoke with [place]. She will send transportation.

## 2013-04-21 NOTE — ED Notes (Signed)
MD at bedside. 

## 2013-04-21 NOTE — ED Notes (Signed)
Patient transported to X-ray 

## 2013-04-21 NOTE — ED Notes (Addendum)
Called [facility] for transport back to [facility]. Pt does not have her walker so she will need assistance to walk safely. Pt is a fall risk. She is unsteady when ambulating without her walker and would be a fall risk.

## 2013-04-21 NOTE — ED Provider Notes (Signed)
CSN: [id number]     Arrival date & time 04/21/13  1234 History   First MD Initiated Contact with Patient 04/21/13 1237     Chief Complaint  Patient presents with  . Weakness   (Consider location/radiation/quality/duration/timing/severity/associated sxs/prior Treatment) HPI  This is a 79 year old female with a history of dementia, hypertension, who presents from her assisted-living facility with generalized weakness. Per EMS report, they were called to [facility] when the patient was found this morning to "not be herself." Patient generally ambulates on her and it is rather independent. Per staff, she needed assistance to go to breakfast and was "not acting normal." The patient herself denies any chest pain or shortness of breath, focal weakness or numbness. She does state that "I just feel weak." She denies any recent fevers. She denies any recent syncope or falls.  Past Medical History  Diagnosis Date  . Dementia   . Hypertension   . GI bleed   . DVT (deep vein thrombosis) in pregnancy   . Dizziness   . Left bundle branch block    Past Surgical History  Procedure Laterality Date  . Abdominal hysterectomy     Family History  Problem Relation Age of Onset  . Hypertension Other    History  Substance Use Topics  . Smoking status: Never Smoker   . Smokeless tobacco: Not on file  . Alcohol Use: No   OB History   Grav Para Term Preterm Abortions TAB SAB Ect Mult Living                 Review of Systems  Constitutional: Negative for fever.  Respiratory: Negative for cough, chest tightness and shortness of breath.   Cardiovascular: Negative for chest pain.  Gastrointestinal: Negative for nausea, vomiting, abdominal pain and blood in stool.  Genitourinary: Negative for dysuria.  Musculoskeletal: Negative for back pain.  Skin: Negative for wound.  Neurological: Positive for weakness. Negative for dizziness, syncope, numbness and headaches.  Psychiatric/Behavioral: Negative for  confusion.  All other systems reviewed and are negative.    Allergies  Penicillins  Home Medications   Current Outpatient Rx  Name  Route  Sig  Dispense  Refill  . acetaminophen (TYLENOL) 500 MG tablet   Oral   Take 500 mg by mouth 3 (three) times daily as needed for pain (takes three times a day and as needed).          Marland Kitchen alendronate (FOSAMAX) 70 MG tablet   Oral   Take 70 mg by mouth every 7 (seven) days. Saturday. Take with a full glass of water on an empty stomach.         Marland Kitchen aspirin EC 81 MG tablet   Oral   Take 81 mg by mouth daily.         . calcium-vitamin D (OSCAL WITH D) 500-200 MG-UNIT per tablet   Oral   Take 1 tablet by mouth daily.           Marland Kitchen donepezil (ARICEPT) 10 MG tablet   Oral   Take 10 mg by mouth at bedtime.         Marland Kitchen doxycycline (VIBRA-TABS) 100 MG tablet   Oral   Take 100 mg by mouth daily. For 10 days         . ENSURE (ENSURE)   Oral   Take 1 Can by mouth daily. chocolate         . ferrous sulfate 325 (65 FE) MG tablet  Oral   Take 325 mg by mouth daily with breakfast. Takes on mondays, wed, and fridays         . HYDROcodone-acetaminophen (NORCO/VICODIN) 5-325 MG per tablet   Oral   Take 1 tablet by mouth every 4 (four) hours as needed for pain.   10 tablet   0   . levothyroxine (SYNTHROID, LEVOTHROID) 25 MCG tablet   Oral   Take 25 mcg by mouth every morning.           . Multiple Vitamins-Minerals (ICAPS AREDS FORMULA PO)   Oral   Take 1 capsule by mouth 2 (two) times daily.         . polyethylene glycol (MIRALAX / GLYCOLAX) packet   Oral   Take 17 g by mouth daily as needed (for constipation). For constipation         . sertraline (ZOLOFT) 50 MG tablet   Oral   Take 75 mg by mouth daily.          . traMADol (ULTRAM) 50 MG tablet   Oral   Take 50 mg by mouth every 6 (six) hours as needed for pain.          BP 172/78  Pulse 75  Temp(Src) 97.8 F (36.6 C) (Oral)  Resp 23  SpO2 100% Physical  Exam  Nursing note and vitals reviewed. Constitutional: No distress.  elderly  HENT:  Head: Normocephalic and atraumatic.  MM dry  Eyes: Pupils are equal, round, and reactive to light.  Neck: Neck supple.  Cardiovascular: Normal rate, regular rhythm and normal heart sounds.   No murmur heard. Pulmonary/Chest: Effort normal and breath sounds normal. No respiratory distress.  Abdominal: Soft. Bowel sounds are normal. There is no tenderness. There is no rebound.  Musculoskeletal: She exhibits no edema.  Neurological: She is alert.  Oriented to person and place but not time, cranial nerves II through XII intact, no dysmetria to finger-nose-finger, 5 out of 5 strength in all 4 extremities  Skin: Skin is warm and dry.  Psychiatric: She has a normal mood and affect.    ED Course  Procedures (including critical care time) Labs Review Labs Reviewed  CBC WITH DIFFERENTIAL - Abnormal; Notable for the following:    RBC 3.17 (*)    Hemoglobin 10.5 (*)    HCT 31.7 (*)    All other components within normal limits  COMPREHENSIVE METABOLIC PANEL - Abnormal; Notable for the following:    Albumin 3.1 (*)    GFR calc non Af Amer 45 (*)    GFR calc Af Amer 52 (*)    All other components within normal limits  OCCULT BLOOD, POC DEVICE - Abnormal; Notable for the following:    Fecal Occult Bld POSITIVE (*)    All other components within normal limits  URINALYSIS, ROUTINE W REFLEX MICROSCOPIC  POCT I-STAT TROPONIN I   Imaging Review Dg Chest 2 View  04/21/2013   CLINICAL DATA:  Body weakness since yesterday  EXAM: CHEST  2 VIEW  COMPARISON:  07/1912  FINDINGS: Mild cardiac enlargement stable. Mild lingular discoid atelectasis and mild scarring at the left base. These findings are stable. The vascular pattern is normal and the lungs are otherwise clear with no pleural effusion. Aortic arch calcification is unchanged.  IMPRESSION: No active cardiopulmonary disease.   Electronically Signed   By:  Esperanza Heir M.D.   On: 04/21/2013 14:12   Ct Head Wo Contrast  04/21/2013   CLINICAL DATA:  Generalized weakness.  EXAM: CT HEAD WITHOUT CONTRAST  TECHNIQUE: Contiguous axial images were obtained from the base of the skull through the vertex without intravenous contrast.  COMPARISON:  Head CT 07/01/2010.  FINDINGS: Well-defined foci of low attenuation in right thalamus, unchanged, compatible with old lacunar infarctions. Moderate cerebral and cerebellar atrophy. Patchy and confluent areas of decreased attenuation are noted throughout the deep and periventricular white matter of the cerebral hemispheres bilaterally, compatible with chronic microvascular ischemic disease. No acute intracranial abnormalities. Specifically, no evidence of acute intracranial hemorrhage, no definite findings of acute/subacute cerebral ischemia, no mass, mass effect, hydrocephalus or abnormal intra or extra-axial fluid collections. Visualized paranasal sinuses and mastoids are well pneumatized. No acute displaced skull fractures are identified.  IMPRESSION: 1. No acute intracranial abnormalities. 2. Moderate cerebral and cerebellar atrophy. 3. Chronic microvascular ischemic changes in the cerebral white matter and old right thalamic lacunar infarctions redemonstrated, as above.   Electronically Signed   By: Trudie Reed M.D.   On: 04/21/2013 13:39    EKG Interpretation    Date/Time:  Sunday April 21 2013 12:37:39 EST Ventricular Rate:  67 PR Interval:  167 QRS Duration: 148 QT Interval:  463 QTC Calculation: 489 R Axis:   -23 Text Interpretation:  Sinus rhythm Left bundle branch block No significant change since last ekg Confirmed by HORTON  MD, COURTNEY ([id number]) on 04/21/2013 2:53:13 PM            MDM   1. Weakness   2. Anemia   3. GI bleed    Patient presents with generalized weakness.  She is nonfocal on neurologic exam. Initial vital signs are within normal limits and she is afebrile. Lab work  is only notable for anemia with a hematocrit at 31. Last known hematocrit was 40. Hemoccult testing is positive. Patient does not wish to be admitted but has dementia. She does have DNR/DNI documentation with her. I have spoken with Windell Moulding and Kevan Ny who state they are the patient's healthcare power of attorney. I have discussed with them my suspicion that she may be weak secondary to anemia and occult GI bleed. They do not wish to pursue further evaluation with colonoscopy or endoscopy.  I am attempting to obtain power of attorney paperwork from the patient's living facility. Patient was able to ambulate and orally challenged independently. Given her goals of care, I feel it is appropriate to discharge home with primary care followup. I also spoke with the patient's primary physician Dr. Pete Glatter who agrees that this plan is consistent with the patient's prior stated wishes and he will follow her in the clinic.  I was unable to obtain official power of attorney documentation; however, there was documentation in the patient's living facility regarding patient's DNR/DNI and goals of care signed by Windell Moulding and Karie Schwalbe.  Given patient's age and DO NOT RESUSCITATE/ DO NOT INTUBATE status, which is appropriately documented, I feel patient's disposition is reasonable given collateral information I received from the family and primary care physician.    Shon Baton, MD 04/21/13 [phone]

## 2013-12-08 ENCOUNTER — Encounter (HOSPITAL_COMMUNITY): Payer: Self-pay | Admitting: Emergency Medicine

## 2013-12-08 ENCOUNTER — Emergency Department (HOSPITAL_COMMUNITY)
Admission: EM | Admit: 2013-12-08 | Discharge: 2013-12-08 | Disposition: A | Discharge status: Home / Self Care | Payer: Medicare Other | Attending: Emergency Medicine | Admitting: Emergency Medicine

## 2013-12-08 ENCOUNTER — Emergency Department (HOSPITAL_COMMUNITY): Payer: Medicare Other

## 2013-12-08 DIAGNOSIS — S93409A Sprain of unspecified ligament of unspecified ankle, initial encounter: Secondary | ICD-10-CM | POA: Insufficient documentation

## 2013-12-08 DIAGNOSIS — S20211A Contusion of right front wall of thorax, initial encounter: Secondary | ICD-10-CM

## 2013-12-08 DIAGNOSIS — R296 Repeated falls: Secondary | ICD-10-CM | POA: Insufficient documentation

## 2013-12-08 DIAGNOSIS — S7000XA Contusion of unspecified hip, initial encounter: Secondary | ICD-10-CM | POA: Insufficient documentation

## 2013-12-08 DIAGNOSIS — S7001XA Contusion of right hip, initial encounter: Secondary | ICD-10-CM

## 2013-12-08 DIAGNOSIS — S20219A Contusion of unspecified front wall of thorax, initial encounter: Secondary | ICD-10-CM | POA: Insufficient documentation

## 2013-12-08 DIAGNOSIS — F0390 Unspecified dementia without behavioral disturbance: Secondary | ICD-10-CM | POA: Insufficient documentation

## 2013-12-08 DIAGNOSIS — Z86718 Personal history of other venous thrombosis and embolism: Secondary | ICD-10-CM | POA: Insufficient documentation

## 2013-12-08 DIAGNOSIS — Y9389 Activity, other specified: Secondary | ICD-10-CM | POA: Insufficient documentation

## 2013-12-08 DIAGNOSIS — Z79899 Other long term (current) drug therapy: Secondary | ICD-10-CM | POA: Insufficient documentation

## 2013-12-08 DIAGNOSIS — Z880 Allergy status to penicillin: Secondary | ICD-10-CM | POA: Insufficient documentation

## 2013-12-08 DIAGNOSIS — Y929 Unspecified place or not applicable: Secondary | ICD-10-CM | POA: Insufficient documentation

## 2013-12-08 DIAGNOSIS — Z7982 Long term (current) use of aspirin: Secondary | ICD-10-CM | POA: Insufficient documentation

## 2013-12-08 DIAGNOSIS — Z5189 Encounter for other specified aftercare: Secondary | ICD-10-CM | POA: Insufficient documentation

## 2013-12-08 DIAGNOSIS — S93401A Sprain of unspecified ligament of right ankle, initial encounter: Secondary | ICD-10-CM

## 2013-12-08 LAB — CBC WITH DIFFERENTIAL/PLATELET
Basophils Absolute: 0 10*3/uL (ref 0.0–0.1)
Basophils Relative: 0 % (ref 0–1)
EOS PCT: 2 % (ref 0–5)
Eosinophils Absolute: 0.1 10*3/uL (ref 0.0–0.7)
HEMATOCRIT: 39.1 % (ref 36.0–46.0)
HEMOGLOBIN: 12.9 g/dL (ref 12.0–15.0)
LYMPHS ABS: 1 10*3/uL (ref 0.7–4.0)
LYMPHS PCT: 20 % (ref 12–46)
MCH: 32.7 pg (ref 26.0–34.0)
MCHC: 33 g/dL (ref 30.0–36.0)
MCV: 99.2 fL (ref 78.0–100.0)
MONO ABS: 0.5 10*3/uL (ref 0.1–1.0)
Monocytes Relative: 11 % (ref 3–12)
NEUTROS ABS: 3.5 10*3/uL (ref 1.7–7.7)
Neutrophils Relative %: 67 % (ref 43–77)
Platelets: 148 10*3/uL — ABNORMAL LOW (ref 150–400)
RBC: 3.94 MIL/uL (ref 3.87–5.11)
RDW: 13.9 % (ref 11.5–15.5)
WBC: 5.2 10*3/uL (ref 4.0–10.5)

## 2013-12-08 LAB — I-STAT CHEM 8, ED
BUN: 26 mg/dL — AB (ref 6–23)
CHLORIDE: 108 meq/L (ref 96–112)
Calcium, Ion: 1.37 mmol/L — ABNORMAL HIGH (ref 1.13–1.30)
Creatinine, Ser: 1.2 mg/dL — ABNORMAL HIGH (ref 0.50–1.10)
Glucose, Bld: 92 mg/dL (ref 70–99)
HEMATOCRIT: 42 % (ref 36.0–46.0)
Hemoglobin: 14.3 g/dL (ref 12.0–15.0)
POTASSIUM: 4.6 meq/L (ref 3.7–5.3)
SODIUM: 141 meq/L (ref 137–147)
TCO2: 23 mmol/L (ref 0–100)

## 2013-12-08 LAB — I-STAT TROPONIN, ED: Troponin i, poc: 0 ng/mL (ref 0.00–0.08)

## 2013-12-08 MED ORDER — FENTANYL CITRATE 0.05 MG/ML IJ SOLN
50.0000 ug | Freq: Once | INTRAMUSCULAR | Status: AC
  Administered 2013-12-08: 50 ug via INTRAVENOUS
  Filled 2013-12-08: qty 2

## 2013-12-08 MED ORDER — HYDROCODONE-ACETAMINOPHEN 5-325 MG PO TABS: 1.0000 | ORAL_TABLET | ORAL | Status: AC | PRN

## 2013-12-08 NOTE — Discharge Instructions (Signed)
Ankle Sprain °An ankle sprain is an injury to the strong, fibrous tissues (ligaments) that hold the bones of your ankle joint together.  °CAUSES °An ankle sprain is usually caused by a fall or by twisting your ankle. Ankle sprains most commonly occur when you step on the outer edge of your foot, and your ankle turns inward. People who participate in sports are more prone to these types of injuries.  °SYMPTOMS  °· Pain in your ankle. The pain may be present at rest or only when you are trying to stand or walk. °· Swelling. °· Bruising. Bruising may develop immediately or within 1 to 2 days after your injury. °· Difficulty standing or walking, particularly when turning corners or changing directions. °DIAGNOSIS  °Your caregiver will ask you details about your injury and perform a physical exam of your ankle to determine if you have an ankle sprain. During the physical exam, your caregiver will press on and apply pressure to specific areas of your foot and ankle. Your caregiver will try to move your ankle in certain ways. An X-ray exam may be done to be sure a bone was not broken or a ligament did not separate from one of the bones in your ankle (avulsion fracture).  °TREATMENT  °Certain types of braces can help stabilize your ankle. Your caregiver can make a recommendation for this. Your caregiver may recommend the use of medicine for pain. If your sprain is severe, your caregiver may refer you to a surgeon who helps to restore function to parts of your skeletal system (orthopedist) or a physical therapist. °HOME CARE INSTRUCTIONS  °· Apply ice to your injury for 1-2 days or as directed by your caregiver. Applying ice helps to reduce inflammation and pain. °¨ Put ice in a plastic bag. °¨ Place a towel between your skin and the bag. °¨ Leave the ice on for 15-20 minutes at a time, every 2 hours while you are awake. °· Only take over-the-counter or prescription medicines for pain, discomfort, or fever as directed by  your caregiver. °· Elevate your injured ankle above the level of your heart as much as possible for 2-3 days. °· If your caregiver recommends crutches, use them as instructed. Gradually put weight on the affected ankle. Continue to use crutches or a cane until you can walk without feeling pain in your ankle. °· If you have a plaster splint, wear the splint as directed by your caregiver. Do not rest it on anything harder than a pillow for the first 24 hours. Do not put weight on it. Do not get it wet. You may take it off to take a shower or bath. °· You may have been given an elastic bandage to wear around your ankle to provide support. If the elastic bandage is too tight (you have numbness or tingling in your foot or your foot becomes cold and blue), adjust the bandage to make it comfortable. °· If you have an air splint, you may blow more air into it or let air out to make it more comfortable. You may take your splint off at night and before taking a shower or bath. Wiggle your toes in the splint several times per day to decrease swelling. °SEEK MEDICAL CARE IF:  °· You have rapidly increasing bruising or swelling. °· Your toes feel extremely cold or you lose feeling in your foot. °· Your pain is not relieved with medicine. °SEEK IMMEDIATE MEDICAL CARE IF: °· Your toes are numb or blue. °·   You have severe pain that is increasing. MAKE SURE YOU:   Understand these instructions.  Will watch your condition.  Will get help right away if you are not doing well or get worse. Document Released: 05/16/2005 Document Revised: 02/08/2012 Document Reviewed: 05/28/2011 [facility] LP[facility] Patient Information 2015 [place], [place]. This information is not intended to replace advice given to you by your health care provider. Make sure you discuss any questions you have with your health care provider.  Chest Contusion A chest contusion is a deep bruise on your chest area. Contusions are the result of an injury that caused bleeding  under the skin. A chest contusion may involve bruising of the skin, muscles, or ribs. The contusion may turn blue, purple, or yellow. Minor injuries will give you a painless contusion, but more severe contusions may stay painful and swollen for a few weeks. CAUSES  A contusion is usually caused by a blow, trauma, or direct force to an area of the body. SYMPTOMS   Swelling and redness of the injured area.  Discoloration of the injured area.  Tenderness and soreness of the injured area.  Pain. DIAGNOSIS  The diagnosis can be made by taking a history and performing a physical exam. An X-ray, CT scan, or MRI may be needed to determine if there were any associated injuries, such as broken bones (fractures) or internal injuries. TREATMENT  Often, the best treatment for a chest contusion is resting, icing, and applying cold compresses to the injured area. Deep breathing exercises may be recommended to reduce the risk of pneumonia. Over-the-counter medicines may also be recommended for pain control. HOME CARE INSTRUCTIONS   Put ice on the injured area.  Put ice in a plastic bag.  Place a towel between your skin and the bag.  Leave the ice on for 15-20 minutes, 03-04 times a day.  Only take over-the-counter or prescription medicines as directed by your caregiver. Your caregiver may recommend avoiding anti-inflammatory medicines (aspirin, ibuprofen, and naproxen) for 48 hours because these medicines may increase bruising.  Rest the injured area.  Perform deep-breathing exercises as directed by your caregiver.  Stop smoking if you smoke.  Do not lift objects over 5 pounds (2.3 kg) for 3 days or longer if recommended by your caregiver. SEEK IMMEDIATE MEDICAL CARE IF:   You have increased bruising or swelling.  You have pain that is getting worse.  You have difficulty breathing.  You have dizziness, weakness, or fainting.  You have blood in your urine or stool.  You cough up or  vomit blood.  Your swelling or pain is not relieved with medicines. MAKE SURE YOU:   Understand these instructions.  Will watch your condition.  Will get help right away if you are not doing well or get worse. Document Released: 02/08/2001 Document Revised: 02/08/2012 Document Reviewed: 11/07/2011 [facility] Patient Information 2015 [place], [place]. This information is not intended to replace advice given to you by your health care provider. Make sure you discuss any questions you have with your health care provider.

## 2013-12-08 NOTE — ED Notes (Signed)
Patient returned to room. 

## 2013-12-08 NOTE — ED Provider Notes (Signed)
CSN: [id number]     Arrival date & time 12/08/13  1191 History   First MD Initiated Contact with Patient 12/08/13 0701     Chief Complaint  Patient presents with  . Chest Pain  . Hip Pain  . Shoulder Pain     (Consider location/radiation/quality/duration/timing/severity/associated sxs/prior Treatment) HPI Comments: PT brought in from [facility].  She was found in the bathroom complaining of right rib pain and some shortness of breath. Per report from EMS, she had a recent fall. Patient appears to be confused and has a history of dementia. She does not remember having a recent fall. She does complain of pain in her right chest. She says it started hurting when she got up to go the bathroom and she was able to get into the bathroom. She denies any shortness of breath. She denies any vomiting. She denies any abdominal pain. History is limited due to her dementia.  Patient is a 80 y.o. female presenting with chest pain, hip pain, and shoulder pain.  Chest Pain Hip Pain Associated symptoms include chest pain.  Shoulder Pain Associated symptoms include chest pain.    Past Medical History  Diagnosis Date  . Dementia   . Hypertension   . GI bleed   . DVT (deep vein thrombosis) in pregnancy   . Dizziness   . Left bundle branch block    Past Surgical History  Procedure Laterality Date  . Abdominal hysterectomy     Family History  Problem Relation Age of Onset  . Hypertension Other    History  Substance Use Topics  . Smoking status: Never Smoker   . Smokeless tobacco: Not on file  . Alcohol Use: No   OB History   Grav Para Term Preterm Abortions TAB SAB Ect Mult Living                 Review of Systems  Unable to perform ROS: Dementia  Cardiovascular: Positive for chest pain.      Allergies  Penicillins  Home Medications   Prior to Admission medications   Medication Sig Start Date End Date Taking? Authorizing Provider  acetaminophen (TYLENOL) 500  MG tablet Take 500 mg by mouth 3 (three) times daily as needed for pain (takes three times a day and as needed).    Yes Historical Provider, MD  aspirin EC 81 MG tablet Take 81 mg by mouth daily.   Yes Historical Provider, MD  Calcium Carbonate-Vitamin D (OYSTER SHELL CALCIUM 500 + D PO) Take 1 tablet by mouth daily.   Yes Historical Provider, MD  donepezil (ARICEPT) 10 MG tablet Take 10 mg by mouth at bedtime.   Yes Historical Provider, MD  ENSURE (ENSURE) Take 1 Can by mouth daily. chocolate   Yes Historical Provider, MD  ferrous sulfate 325 (65 FE) MG tablet Take 325 mg by mouth daily with breakfast.    Yes Historical Provider, MD  HYDROcodone-acetaminophen (NORCO/VICODIN) 5-325 MG per tablet Take 1 tablet by mouth every 4 (four) hours as needed for pain. 08/15/12  Yes Glynn Octave, MD  levothyroxine (SYNTHROID, LEVOTHROID) 25 MCG tablet Take 25 mcg by mouth every morning.     Yes Historical Provider, MD  Multiple Vitamins-Minerals (ICAPS AREDS FORMULA PO) Take 1 capsule by mouth 2 (two) times daily.   Yes Historical Provider, MD  omeprazole (PRILOSEC) 20 MG capsule Take 20 mg by mouth daily.   Yes Historical Provider, MD  polyethylene glycol (MIRALAX / GLYCOLAX) packet Take 17 g  by mouth daily. For constipation   Yes Historical Provider, MD  sertraline (ZOLOFT) 50 MG tablet Take 75 mg by mouth daily.    Yes Historical Provider, MD  traMADol (ULTRAM) 50 MG tablet Take 50 mg by mouth every 6 (six) hours as needed for pain.   Yes Historical Provider, MD  HYDROcodone-acetaminophen (NORCO/VICODIN) 5-325 MG per tablet Take 1 tablet by mouth every 4 (four) hours as needed for moderate pain. 12/08/13   Rolan Bucco, MD   BP 165/75  Pulse 70  Temp(Src) 97.8 F (36.6 C) (Oral)  Resp 22  Ht 5' 7.5" (1.715 m)  Wt 130 lb (58.968 kg)  BMI 20.05 kg/m2  SpO2 93% Physical Exam  Constitutional: She appears well-developed and well-nourished.  HENT:  Head: Normocephalic and atraumatic.  Eyes: Pupils  are equal, round, and reactive to light.  Neck: Normal range of motion. Neck supple.  Cardiovascular: Normal rate, regular rhythm and normal heart sounds.   Pulmonary/Chest: Effort normal and breath sounds normal. No respiratory distress. She has no wheezes. She has no rales. She exhibits tenderness (tendernes across the right anterior/lateral mid ribs.  no crepitus or deformity. no ecchymosis).  Abdominal: Soft. Bowel sounds are normal. There is no tenderness. There is no rebound and no guarding.  Musculoskeletal: Normal range of motion. She exhibits no edema.  Mild pain on ROM of right shoulder, right hip, right ankle.  No other pain on palpation or ROM of the extremities  Lymphadenopathy:    She has no cervical adenopathy.  Neurological: She is alert.  Alert and answers questions, but confused.  Moves all extremities symmetrically.  No facial droop  Skin: Skin is warm and dry. No rash noted.  Psychiatric: She has a normal mood and affect.    ED Course  Procedures (including critical care time) Labs Review Results for orders placed during the hospital encounter of 12/08/13  CBC WITH DIFFERENTIAL      Result Value Ref Range   WBC 5.2  4.0 - 10.5 K/uL   RBC 3.94  3.87 - 5.11 MIL/uL   Hemoglobin 12.9  12.0 - 15.0 g/dL   HCT 16.1  09.6 - 04.5 %   MCV 99.2  78.0 - 100.0 fL   MCH 32.7  26.0 - 34.0 pg   MCHC 33.0  30.0 - 36.0 g/dL   RDW 40.9  81.1 - 91.4 %   Platelets 148 (*) 150 - 400 K/uL   Neutrophils Relative % 67  43 - 77 %   Neutro Abs 3.5  1.7 - 7.7 K/uL   Lymphocytes Relative 20  12 - 46 %   Lymphs Abs 1.0  0.7 - 4.0 K/uL   Monocytes Relative 11  3 - 12 %   Monocytes Absolute 0.5  0.1 - 1.0 K/uL   Eosinophils Relative 2  0 - 5 %   Eosinophils Absolute 0.1  0.0 - 0.7 K/uL   Basophils Relative 0  0 - 1 %   Basophils Absolute 0.0  0.0 - 0.1 K/uL  I-STAT TROPOININ, ED      Result Value Ref Range   Troponin i, poc 0.00  0.00 - 0.08 ng/mL   Comment 3           I-STAT CHEM 8,  ED      Result Value Ref Range   Sodium 141  137 - 147 mEq/L   Potassium 4.6  3.7 - 5.3 mEq/L   Chloride 108  96 - 112 mEq/L  BUN 26 (*) 6 - 23 mg/dL   Creatinine, Ser 1.611.20 (*) 0.50 - 1.10 mg/dL   Glucose, Bld 92  70 - 99 mg/dL   Calcium, Ion 0.961.37 (*) 1.13 - 1.30 mmol/L   TCO2 23  0 - 100 mmol/L   Hemoglobin 14.3  12.0 - 15.0 g/dL   HCT 04.542.0  40.936.0 - 81.146.0 %   Dg Ribs Unilateral W/chest Right  12/08/2013   CLINICAL DATA:  Fall, chest pain  EXAM: RIGHT RIBS AND CHEST - 3+ VIEW  COMPARISON:  Chest radiograph 04/21/2013  FINDINGS: Normal cardiac silhouette. Chronic bronchitic change in the lungs. No pneumothorax. Dedicated view of the right ribs demonstrates no displaced rib fracture. Severe degenerative change of the spine with scoliosis.  IMPRESSION: 1. Chronic bronchitic markings. 2. No pneumothorax. 3. No evidence of acute thoracic trauma.   Electronically Signed   By: Genevive BiStewart  Edmunds M.D.   On: 12/08/2013 09:00   Dg Shoulder Right  12/08/2013   CLINICAL DATA:  Right shoulder pain.  EXAM: RIGHT SHOULDER - 2+ VIEW  COMPARISON:  None.  FINDINGS: Severe degenerative changes of the right shoulder. Diffuse severe osteopenia. Surgical screws are noted in the right humeral head. No evidence of fracture, dislocation, or separation.  IMPRESSION: Severe degenerative changes and osteopenia right shoulder. Postsurgical changes right shoulder. No acute abnormality.   Electronically Signed   By: Maisie Fushomas  Register   On: 12/08/2013 09:00   Dg Hip Complete Right  12/08/2013   CLINICAL DATA:  Posterior right hip pain, fall  EXAM: RIGHT HIP - COMPLETE 2+ VIEW  COMPARISON:  CT 04/14/2011  FINDINGS: There is a remote fracture of the right superior and inferior pubic ramus. The right hip is located. No evidence of femoral neck fracture.  IMPRESSION: 1. No evidence of acute pelvic fracture or right hip fracture. 2. Remote right pubic ramus fracture.   Electronically Signed   By: Genevive BiStewart  Edmunds M.D.   On: 12/08/2013  08:58   Dg Ankle Complete Right  12/08/2013   CLINICAL DATA:  Ankle swelling.  EXAM: RIGHT ANKLE - COMPLETE 3+ VIEW  COMPARISON:  None.  FINDINGS: Diffuse soft tissue swelling. No evidence of fracture or dislocation. Diffuse osteopenia is present.  IMPRESSION: Diffuse soft tissue swelling. No evidence of fracture or dislocation.   Electronically Signed   By: Maisie Fushomas  Register   On: 12/08/2013 09:03      Imaging Review Dg Ribs Unilateral W/chest Right  12/08/2013   CLINICAL DATA:  Fall, chest pain  EXAM: RIGHT RIBS AND CHEST - 3+ VIEW  COMPARISON:  Chest radiograph 04/21/2013  FINDINGS: Normal cardiac silhouette. Chronic bronchitic change in the lungs. No pneumothorax. Dedicated view of the right ribs demonstrates no displaced rib fracture. Severe degenerative change of the spine with scoliosis.  IMPRESSION: 1. Chronic bronchitic markings. 2. No pneumothorax. 3. No evidence of acute thoracic trauma.   Electronically Signed   By: Genevive BiStewart  Edmunds M.D.   On: 12/08/2013 09:00   Dg Shoulder Right  12/08/2013   CLINICAL DATA:  Right shoulder pain.  EXAM: RIGHT SHOULDER - 2+ VIEW  COMPARISON:  None.  FINDINGS: Severe degenerative changes of the right shoulder. Diffuse severe osteopenia. Surgical screws are noted in the right humeral head. No evidence of fracture, dislocation, or separation.  IMPRESSION: Severe degenerative changes and osteopenia right shoulder. Postsurgical changes right shoulder. No acute abnormality.   Electronically Signed   By: Maisie Fushomas  Register   On: 12/08/2013 09:00   Dg Hip Complete Right  12/08/2013   CLINICAL DATA:  Posterior right hip pain, fall  EXAM: RIGHT HIP - COMPLETE 2+ VIEW  COMPARISON:  CT 04/14/2011  FINDINGS: There is a remote fracture of the right superior and inferior pubic ramus. The right hip is located. No evidence of femoral neck fracture.  IMPRESSION: 1. No evidence of acute pelvic fracture or right hip fracture. 2. Remote right pubic ramus fracture.    Electronically Signed   By: Genevive Bi M.D.   On: 12/08/2013 08:58   Dg Ankle Complete Right  12/08/2013   CLINICAL DATA:  Ankle swelling.  EXAM: RIGHT ANKLE - COMPLETE 3+ VIEW  COMPARISON:  None.  FINDINGS: Diffuse soft tissue swelling. No evidence of fracture or dislocation. Diffuse osteopenia is present.  IMPRESSION: Diffuse soft tissue swelling. No evidence of fracture or dislocation.   Electronically Signed   By: Maisie Fus  Register   On: 12/08/2013 09:03     EKG Interpretation None      Date: 12/08/2013  Rate: 59  Rhythm: normal sinus rhythm  QRS Axis: left  Intervals: normal  ST/T Wave abnormalities: nonspecific ST/T changes  Conduction Disutrbances:left bundle branch block  Narrative Interpretation:   Old EKG Reviewed: unchanged   MDM   Final diagnoses:  Rib contusion, right, initial encounter  Ankle sprain, right, initial encounter  Contusion, hip, right, initial encounter    Spoke with [facility] staff.  Pt was sent over today after complaining of right rib pain.  She apparently had an unwitnesed fall one week ago and has been complaining of rib pain since that time.  X-rays negative, will send back to NH.  Rolan Bucco, MD 12/08/13 [phone]

## 2013-12-08 NOTE — ED Notes (Signed)
Nasal cannula removed to obtain room air o2 sat

## 2013-12-08 NOTE — ED Notes (Signed)
Dr. Belfie at bedside 

## 2013-12-08 NOTE — ED Notes (Signed)
Notified [facility] for transportation back to [facility]

## 2013-12-08 NOTE — ED Notes (Addendum)
Patient resides in [facility].  She had reported fall a few days ago.  Patient was up to the bathroom today and could not get up due to pain in her right side ribs and hip.  She also reported sob.  Vital signs with ems were 150/78, 94. 16, 100 percent on 2 liters, [place]  She also has crepitius in her right shoulder per the EMS.  Patient is alert with some confusion.  She is able to answer most questions.  Patient with increased pain with movement and palpation.

## 2014-04-29 ENCOUNTER — Emergency Department (HOSPITAL_COMMUNITY)
Admission: EM | Admit: 2014-04-29 | Discharge: 2014-04-30 | Disposition: A | Discharge status: Home / Self Care | Payer: Medicare Other | Attending: Emergency Medicine | Admitting: Emergency Medicine

## 2014-04-29 ENCOUNTER — Encounter (HOSPITAL_COMMUNITY): Payer: Self-pay

## 2014-04-29 ENCOUNTER — Emergency Department (HOSPITAL_COMMUNITY): Payer: Medicare Other

## 2014-04-29 DIAGNOSIS — R0789 Other chest pain: Secondary | ICD-10-CM | POA: Insufficient documentation

## 2014-04-29 DIAGNOSIS — I447 Left bundle-branch block, unspecified: Secondary | ICD-10-CM | POA: Diagnosis not present

## 2014-04-29 DIAGNOSIS — I10 Essential (primary) hypertension: Secondary | ICD-10-CM | POA: Insufficient documentation

## 2014-04-29 DIAGNOSIS — Z880 Allergy status to penicillin: Secondary | ICD-10-CM | POA: Insufficient documentation

## 2014-04-29 DIAGNOSIS — G40909 Epilepsy, unspecified, not intractable, without status epilepticus: Secondary | ICD-10-CM | POA: Insufficient documentation

## 2014-04-29 DIAGNOSIS — I429 Cardiomyopathy, unspecified: Secondary | ICD-10-CM | POA: Diagnosis not present

## 2014-04-29 DIAGNOSIS — Z79899 Other long term (current) drug therapy: Secondary | ICD-10-CM | POA: Insufficient documentation

## 2014-04-29 DIAGNOSIS — R011 Cardiac murmur, unspecified: Secondary | ICD-10-CM | POA: Insufficient documentation

## 2014-04-29 DIAGNOSIS — R079 Chest pain, unspecified: Secondary | ICD-10-CM | POA: Diagnosis present

## 2014-04-29 DIAGNOSIS — Z7982 Long term (current) use of aspirin: Secondary | ICD-10-CM | POA: Insufficient documentation

## 2014-04-29 DIAGNOSIS — Z86718 Personal history of other venous thrombosis and embolism: Secondary | ICD-10-CM | POA: Insufficient documentation

## 2014-04-29 DIAGNOSIS — Z8719 Personal history of other diseases of the digestive system: Secondary | ICD-10-CM | POA: Insufficient documentation

## 2014-04-29 LAB — BASIC METABOLIC PANEL
ANION GAP: 14 (ref 5–15)
BUN: 24 mg/dL — AB (ref 6–23)
CALCIUM: 9.7 mg/dL (ref 8.4–10.5)
CHLORIDE: 105 meq/L (ref 96–112)
CO2: 23 mEq/L (ref 19–32)
CREATININE: 1.1 mg/dL (ref 0.50–1.10)
GFR calc Af Amer: 48 mL/min — ABNORMAL LOW (ref >90)
GFR calc non Af Amer: 42 mL/min — ABNORMAL LOW (ref >90)
Glucose, Bld: 89 mg/dL (ref 70–99)
Potassium: 4.9 mEq/L (ref 3.7–5.3)
Sodium: 142 mEq/L (ref 137–147)

## 2014-04-29 LAB — CBC
HCT: 39.8 % (ref 36.0–46.0)
HEMOGLOBIN: 13.2 g/dL (ref 12.0–15.0)
MCH: 33.6 pg (ref 26.0–34.0)
MCHC: 33.2 g/dL (ref 30.0–36.0)
MCV: 101.3 fL — ABNORMAL HIGH (ref 78.0–100.0)
Platelets: 160 10*3/uL (ref 150–400)
RBC: 3.93 MIL/uL (ref 3.87–5.11)
RDW: 13.3 % (ref 11.5–15.5)
WBC: 5.4 10*3/uL (ref 4.0–10.5)

## 2014-04-29 LAB — I-STAT TROPONIN, ED
Troponin i, poc: 0 ng/mL (ref 0.00–0.08)
Troponin i, poc: 0.01 ng/mL (ref 0.00–0.08)

## 2014-04-29 LAB — PRO B NATRIURETIC PEPTIDE: PRO B NATRI PEPTIDE: 1296 pg/mL — AB (ref 0–450)

## 2014-04-29 NOTE — Discharge Instructions (Signed)

## 2014-04-29 NOTE — ED Notes (Signed)
[facility] called to transport pt back to [facility] [place].  [facility]

## 2014-04-29 NOTE — ED Notes (Signed)
To room via EMS.  Onset between 12-1pm today pt started having chest pain underneath left breast and shortness of breath.  EMS gave ASA 324 mg, NTG x 1.  EMS EKG showed LBBB.

## 2014-04-29 NOTE — ED Provider Notes (Signed)
Medical screening examination/treatment/procedure(s) were conducted as a shared visit with non-physician practitioner(s) and myself.  I personally evaluated the patient during the encounter.   EKG Interpretation   Date/Time:  Tuesday April 29 2014 16:38:33 EST Ventricular Rate:  63 PR Interval:  164 QRS Duration: 150 QT Interval:  482 QTC Calculation: 493 R Axis:   -26 Text Interpretation:  Sinus rhythm Left bundle branch block No significant  change since last tracing Reconfirmed by Merrill Deanda  MD, Oree Mirelez [phone]([id number]) on  04/29/2014 4:51:02 PM      Results for orders placed or performed during the hospital encounter of 12/08/13  CBC with Differential  Result Value Ref Range   WBC 5.2 4.0 - 10.5 K/uL   RBC 3.94 3.87 - 5.11 MIL/uL   Hemoglobin 12.9 12.0 - 15.0 g/dL   HCT 60.439.1 54.036.0 - 98.146.0 %   MCV 99.2 78.0 - 100.0 fL   MCH 32.7 26.0 - 34.0 pg   MCHC 33.0 30.0 - 36.0 g/dL   RDW 19.113.9 47.811.5 - 29.515.5 %   Platelets 148 (L) 150 - 400 K/uL   Neutrophils Relative % 67 43 - 77 %   Neutro Abs 3.5 1.7 - 7.7 K/uL   Lymphocytes Relative 20 12 - 46 %   Lymphs Abs 1.0 0.7 - 4.0 K/uL   Monocytes Relative 11 3 - 12 %   Monocytes Absolute 0.5 0.1 - 1.0 K/uL   Eosinophils Relative 2 0 - 5 %   Eosinophils Absolute 0.1 0.0 - 0.7 K/uL   Basophils Relative 0 0 - 1 %   Basophils Absolute 0.0 0.0 - 0.1 K/uL  I-stat troponin, ED  Result Value Ref Range   Troponin i, poc 0.00 0.00 - 0.08 ng/mL   Comment 3          I-stat chem 8, ed  Result Value Ref Range   Sodium 141 137 - 147 mEq/L   Potassium 4.6 3.7 - 5.3 mEq/L   Chloride 108 96 - 112 mEq/L   BUN 26 (H) 6 - 23 mg/dL   Creatinine, Ser 6.211.20 (H) 0.50 - 1.10 mg/dL   Glucose, Bld 92 70 - 99 mg/dL   Calcium, Ion 3.081.37 (H) 1.13 - 1.30 mmol/L   TCO2 23 0 - 100 mmol/L   Hemoglobin 14.3 12.0 - 15.0 g/dL   HCT 65.742.0 84.636.0 - 96.246.0 %   Dg Chest 2 View  04/29/2014   CLINICAL DATA:  Left lower chest pain for 2 days. Shortness of breath.  EXAM: CHEST  2 VIEW   COMPARISON:  04/21/2013 and 12/08/2013  FINDINGS: There is chronic cardiomegaly with tortuosity and calcification of the thoracic aorta. Pulmonary vascularity is normal. There is chronic accentuation of the interstitial markings in both lungs. No infiltrates or effusions. Chronic accentuation of the thoracic kyphosis. Diffuse osteopenia. No visible fractures. Arthritis of both shoulders.  The lungs are hyperinflated consistent with emphysema.  IMPRESSION: Emphysema.  No acute abnormalities.   Electronically Signed   By: Geanie CooleyJim  Maxwell M.D.   On: 04/29/2014 17:20   Old labs above. The chest x-rays from today. No acute changes and chest x-ray. Patient seen by me. We contacted the nursing home. They state that symptoms started around 3:00. Despite what was reported by EMS. Patient has a history of dementia. Has no memory of any chest pain. Nursing home stay she complained of chest pain around that time. Patient given aspirin and what some lingual nitroglycerin by EMS. Patient with no known coronary artery disease. However  does have left bundle branch block pattern is not new. Also has history of some valvular problems. Patient will get serial enzymes to rule out MI. Due to her dementia if they remain negative sickly at 6 hour mark a be able to discharge patient home. Patient on exam heart regular rate and rhythm but does have a murmur lungs are clear bilaterally. Abdomen is soft and nontender. Patient is pleasant awake and alert but has no recollection of having chest pain. Level V caveat applies to both the history and the review of systems due to her dementia.  Vanetta MuldersScott Deral Schellenberg, MD 04/29/14 [phone]

## 2014-04-29 NOTE — ED Notes (Signed)
Contacted [facility] to tx patient to [facility]

## 2014-04-29 NOTE — ED Provider Notes (Signed)
CSN: [id number]     Arrival date & time 04/29/14  1636 History   First MD Initiated Contact with Patient 04/29/14 1645     Chief Complaint  Patient presents with  . Chest Pain   Level V caveat due to the patient's dementia. The patient is able to answer questions appropriately but we are unsure of the accuracy due to the patient's dementia. The nursing home also reports she is having chest pain which the patient now denies.   Rebecca Dawson is a 80 y.o. female with history of dementia, hypertension, left bundle branch block and cardiomyopathy coming from [facility] [place] [facility] who presents to emergency department complaining of feeling shaky and left sided chest pain associated with shortness of breath starting around 3 pm today. Patient has history of dementia and she is slightly confused as the exact progression of events earlier today. The patient reports she was sitting at a table in the activity room when she started having left-sided chest pain associated with shortness of breath and feeling shaky. Patient reports her chest pain hurts worse with movement or touching. [facility] to get a better history. The nurse confirms that the patient started complaining of chest pain free p.m. today and feeling shaky and short of breath. The nurse denies she has had any recent falls or has been complaining  Patient denies current chest pain unless moving. Patient denies current shortness of breath. Patient currently denies feeling shaky. The patient denies fevers, palpitations, cough, abdominal pain, nausea, vomiting, sore throat, headache, lightheadedness, leg swelling or rashes. The patient denies recent falls. The patient has a hx of cardiomyopathy and her last echo in our records show an ejection fraction of 35% in 2012.    (Consider location/radiation/quality/duration/timing/severity/associated sxs/prior Treatment) HPI  Past Medical History  Diagnosis Date   . Dementia   . Hypertension   . GI bleed   . DVT (deep vein thrombosis) in pregnancy   . Dizziness   . Left bundle branch block    Past Surgical History  Procedure Laterality Date  . Abdominal hysterectomy     Family History  Problem Relation Age of Onset  . Hypertension Other    History  Substance Use Topics  . Smoking status: Never Smoker   . Smokeless tobacco: Not on file  . Alcohol Use: No   OB History    No data available     Review of Systems  Unable to perform ROS Constitutional: Negative for fever and chills.  HENT: Negative for congestion, ear pain, sore throat and trouble swallowing.   Eyes: Negative for pain and redness.  Respiratory: Negative for cough and wheezing.   Cardiovascular: Positive for chest pain. Negative for palpitations.  Gastrointestinal: Negative for nausea, vomiting, abdominal pain, diarrhea and blood in stool.  Genitourinary: Negative for dysuria, hematuria and difficulty urinating.  Musculoskeletal: Negative for back pain and neck pain.  Skin: Negative for rash and wound.  Neurological: Negative for dizziness, syncope, speech difficulty, light-headedness, numbness and headaches.      Allergies  Penicillins  Home Medications   Prior to Admission medications   Medication Sig Start Date End Date Taking? Authorizing Provider  acetaminophen (TYLENOL) 500 MG tablet Take 500 mg by mouth 3 (three) times daily as needed for pain (takes three times a day and as needed).    Yes Historical Provider, MD  aspirin EC 81 MG tablet Take 81 mg by mouth daily.  Yes Historical Provider, MD  Calcium Carbonate-Vitamin D (OYSTER SHELL CALCIUM 500 + D PO) Take 1 tablet by mouth daily.   Yes Historical Provider, MD  donepezil (ARICEPT) 10 MG tablet Take 10 mg by mouth at bedtime.   Yes Historical Provider, MD  ENSURE (ENSURE) Take 1 Can by mouth daily. chocolate   Yes Historical Provider, MD  ferrous sulfate 325 (65 FE) MG tablet Take 325 mg by mouth  daily with breakfast.    Yes Historical Provider, MD  HYDROcodone-acetaminophen (NORCO/VICODIN) 5-325 MG per tablet Take 1 tablet by mouth every 4 (four) hours as needed for moderate pain. 12/08/13  Yes Rolan Bucco, MD  levothyroxine (SYNTHROID, LEVOTHROID) 25 MCG tablet Take 25 mcg by mouth every morning.     Yes Historical Provider, MD  Multiple Vitamins-Minerals (ICAPS AREDS FORMULA PO) Take 1 capsule by mouth 2 (two) times daily.   Yes Historical Provider, MD  omeprazole (PRILOSEC) 20 MG capsule Take 20 mg by mouth daily.   Yes Historical Provider, MD  polyethylene glycol (MIRALAX / GLYCOLAX) packet Take 17 g by mouth daily. For constipation   Yes Historical Provider, MD  sertraline (ZOLOFT) 50 MG tablet Take 75 mg by mouth daily.    Yes Historical Provider, MD  traMADol (ULTRAM) 50 MG tablet Take 50 mg by mouth every 6 (six) hours as needed for pain.   Yes Historical Provider, MD  HYDROcodone-acetaminophen (NORCO/VICODIN) 5-325 MG per tablet Take 1 tablet by mouth every 4 (four) hours as needed for pain. Patient not taking: Reported on 04/29/2014 08/15/12   Glynn Octave, MD   BP 153/73 mmHg  Pulse 66  Temp(Src) 97.8 F (36.6 C) (Oral)  Resp 21  Ht 5\' 7"  (1.702 m)  Wt 150 lb (68.04 kg)  BMI 23.49 kg/m2  SpO2 95% Physical Exam  Constitutional: She appears well-developed and well-nourished. No distress.  HENT:  Head: Normocephalic and atraumatic.  Right Ear: External ear normal.  Left Ear: External ear normal.  Mouth/Throat: Oropharynx is clear and moist. No oropharyngeal exudate.  Eyes: Conjunctivae are normal. Pupils are equal, round, and reactive to light. Right eye exhibits no discharge. Left eye exhibits no discharge.  Neck: Normal range of motion. Neck supple.  Cardiovascular: Normal rate, regular rhythm and intact distal pulses.  Exam reveals no gallop and no friction rub.   Murmur heard. Blowing systolic murmur noted. Bilateral radial pulses are intact. Bilateral posterior  tibialis pulses are intact.  Pulmonary/Chest: Effort normal and breath sounds normal. No respiratory distress. She has no wheezes. She has no rales. She exhibits tenderness.  Patient has some moderate left sided chest tenderness to palpation.   Abdominal: Soft. Bowel sounds are normal. She exhibits no distension and no mass. There is no tenderness. There is no rebound and no guarding.  Musculoskeletal: She exhibits no edema.  No lower extremity edema.   Lymphadenopathy:    She has no cervical adenopathy.  Neurological: She is alert. Coordination normal.  Patient is oriented to person and place. Nursing staf at [facility] report this is her baseline. Patient is somewhat confused as to the exact progression of events that led to her arrival to the emergency department today.  Skin: Skin is warm and dry. No rash noted. She is not diaphoretic. No erythema. No pallor.  Psychiatric: She has a normal mood and affect. Her behavior is normal.    ED Course  Procedures (including critical care time) Labs Review Labs Reviewed  PRO B NATRIURETIC PEPTIDE - Abnormal; Notable for  the following:    Pro B Natriuretic peptide (BNP) 1296.0 (*)    All other components within normal limits  CBC - Abnormal; Notable for the following:    MCV 101.3 (*)    All other components within normal limits  BASIC METABOLIC PANEL - Abnormal; Notable for the following:    BUN 24 (*)    GFR calc non Af Amer 42 (*)    GFR calc Af Amer 48 (*)    All other components within normal limits  I-STAT TROPOININ, ED  I-STAT TROPOININ, ED  Rosezena Sensor, ED    Imaging Review Dg Chest 2 View  04/29/2014   CLINICAL DATA:  Left lower chest pain for 2 days. Shortness of breath.  EXAM: CHEST  2 VIEW  COMPARISON:  04/21/2013 and 12/08/2013  FINDINGS: There is chronic cardiomegaly with tortuosity and calcification of the thoracic aorta. Pulmonary vascularity is normal. There is chronic accentuation of the interstitial markings in  both lungs. No infiltrates or effusions. Chronic accentuation of the thoracic kyphosis. Diffuse osteopenia. No visible fractures. Arthritis of both shoulders.  The lungs are hyperinflated consistent with emphysema.  IMPRESSION: Emphysema.  No acute abnormalities.   Electronically Signed   By: Geanie Cooley M.D.   On: 04/29/2014 17:20     EKG Interpretation   Date/Time:  Tuesday April 29 2014 16:38:33 EST Ventricular Rate:  63 PR Interval:  164 QRS Duration: 150 QT Interval:  482 QTC Calculation: 493 R Axis:   -26 Text Interpretation:  Sinus rhythm Left bundle branch block No significant  change since last tracing Reconfirmed by ZACKOWSKI  MD, SCOTT [phone]) on  04/29/2014 4:51:02 PM     Filed Vitals:   04/29/14 2055 04/29/14 2100 04/29/14 2310 04/29/14 2358  BP: 163/67  162/59 153/73  Pulse: 60 55 67 66  Temp: 97.7 F (36.5 C)  97.9 F (36.6 C) 97.8 F (36.6 C)  TempSrc: Oral  Oral Oral  Resp: 18  14 21   Height:      Weight:      SpO2: 97% 97% 95% 95%    MDM   Meds given in ED:  Medications - No data to display  Discharge Medication List as of 04/29/2014 11:56 PM      Final diagnoses:  Atypical chest pain    Rebecca Dawson is a 80 y.o. female with history of dementia, hypertension, left bundle branch block and cardiomyopathy coming from [facility] [place] [facility] who presents to emergency department complaining of feeling shaky and left sided chest pain associated with shortness of breath starting around 3 pm today. The patient denies current chest pain or shortness of breath. Patient does have dementia, but is at her baseline currently. Patient is in sinus rhythm with left bundle branch block with no significant changes from her last tracing. A negative troponin and delta troponin. Patient has an elevated BNP at 1296. The patient denies current shortness of breath. Her oxygen saturation has been within normal limits on room air. Patient's chest x-ray was unremarkable.  The patient's CBC was unremarkable. The patient's BMP was also unremarkable. During the patient's stay in the emergency department the patient has denied any complaints. The patient is afebrile and nontoxic appearing. The patient reports she is ready to go home. Patient will be discharged to the care of her skilled nursing facility. Advised patient she should follow-up with [facility]. Instructions were left with discharge instructions. Advised patient  to return to the emergency department anytime for new or worsening symptoms or new concerns. The patient verbalized understanding and agreement with plan.  The patient was discussed with and evaluated by Dr. Deretha EmoryZackowski who agrees with assessment and plan.      Lawana ChambersWilliam Duncan Emerita Berkemeier, PA-C 04/30/14 [phone]

## 2014-05-15 ENCOUNTER — Emergency Department (HOSPITAL_COMMUNITY): Payer: Medicare Other

## 2014-05-15 ENCOUNTER — Observation Stay (HOSPITAL_COMMUNITY): Payer: Medicare Other

## 2014-05-15 ENCOUNTER — Encounter (HOSPITAL_COMMUNITY): Payer: Self-pay | Admitting: *Deleted

## 2014-05-15 ENCOUNTER — Inpatient Hospital Stay (HOSPITAL_COMMUNITY)
Admission: EM | Admit: 2014-05-15 | Discharge: 2014-05-20 | DRG: 065 | Payer: Medicare Other | Attending: Internal Medicine | Admitting: Internal Medicine

## 2014-05-15 DIAGNOSIS — S72002A Fracture of unspecified part of neck of left femur, initial encounter for closed fracture: Secondary | ICD-10-CM

## 2014-05-15 DIAGNOSIS — Z880 Allergy status to penicillin: Secondary | ICD-10-CM

## 2014-05-15 DIAGNOSIS — R531 Weakness: Secondary | ICD-10-CM | POA: Diagnosis not present

## 2014-05-15 DIAGNOSIS — G8929 Other chronic pain: Secondary | ICD-10-CM | POA: Diagnosis present

## 2014-05-15 DIAGNOSIS — I447 Left bundle-branch block, unspecified: Secondary | ICD-10-CM | POA: Diagnosis present

## 2014-05-15 DIAGNOSIS — I639 Cerebral infarction, unspecified: Secondary | ICD-10-CM | POA: Diagnosis not present

## 2014-05-15 DIAGNOSIS — F0390 Unspecified dementia without behavioral disturbance: Secondary | ICD-10-CM | POA: Diagnosis present

## 2014-05-15 DIAGNOSIS — Z515 Encounter for palliative care: Secondary | ICD-10-CM

## 2014-05-15 DIAGNOSIS — E039 Hypothyroidism, unspecified: Secondary | ICD-10-CM | POA: Diagnosis present

## 2014-05-15 DIAGNOSIS — G459 Transient cerebral ischemic attack, unspecified: Secondary | ICD-10-CM | POA: Diagnosis present

## 2014-05-15 DIAGNOSIS — Z66 Do not resuscitate: Secondary | ICD-10-CM | POA: Diagnosis present

## 2014-05-15 DIAGNOSIS — M25552 Pain in left hip: Secondary | ICD-10-CM

## 2014-05-15 DIAGNOSIS — R52 Pain, unspecified: Secondary | ICD-10-CM

## 2014-05-15 DIAGNOSIS — Z86718 Personal history of other venous thrombosis and embolism: Secondary | ICD-10-CM

## 2014-05-15 DIAGNOSIS — S32591A Other specified fracture of right pubis, initial encounter for closed fracture: Secondary | ICD-10-CM | POA: Diagnosis present

## 2014-05-15 DIAGNOSIS — S3210XA Unspecified fracture of sacrum, initial encounter for closed fracture: Secondary | ICD-10-CM | POA: Diagnosis present

## 2014-05-15 DIAGNOSIS — Z7982 Long term (current) use of aspirin: Secondary | ICD-10-CM

## 2014-05-15 DIAGNOSIS — I10 Essential (primary) hypertension: Secondary | ICD-10-CM | POA: Diagnosis present

## 2014-05-15 DIAGNOSIS — F0391 Unspecified dementia with behavioral disturbance: Secondary | ICD-10-CM

## 2014-05-15 DIAGNOSIS — M549 Dorsalgia, unspecified: Secondary | ICD-10-CM | POA: Diagnosis present

## 2014-05-15 DIAGNOSIS — M25559 Pain in unspecified hip: Secondary | ICD-10-CM

## 2014-05-15 DIAGNOSIS — M25551 Pain in right hip: Secondary | ICD-10-CM | POA: Diagnosis present

## 2014-05-15 LAB — TROPONIN I

## 2014-05-15 LAB — RAPID URINE DRUG SCREEN, HOSP PERFORMED
AMPHETAMINES: NOT DETECTED
BARBITURATES: NOT DETECTED
BENZODIAZEPINES: NOT DETECTED
COCAINE: NOT DETECTED
Opiates: NOT DETECTED
Tetrahydrocannabinol: NOT DETECTED

## 2014-05-15 LAB — CBC
HEMATOCRIT: 40 % (ref 36.0–46.0)
Hemoglobin: 12.8 g/dL (ref 12.0–15.0)
MCH: 31.8 pg (ref 26.0–34.0)
MCHC: 32 g/dL (ref 30.0–36.0)
MCV: 99.5 fL (ref 78.0–100.0)
PLATELETS: 207 10*3/uL (ref 150–400)
RBC: 4.02 MIL/uL (ref 3.87–5.11)
RDW: 13.3 % (ref 11.5–15.5)
WBC: 5.6 10*3/uL (ref 4.0–10.5)

## 2014-05-15 LAB — CBC WITH DIFFERENTIAL/PLATELET
BASOS PCT: 0 % (ref 0–1)
Basophils Absolute: 0 10*3/uL (ref 0.0–0.1)
Eosinophils Absolute: 0.1 10*3/uL (ref 0.0–0.7)
Eosinophils Relative: 2 % (ref 0–5)
HCT: 38.8 % (ref 36.0–46.0)
Hemoglobin: 12.7 g/dL (ref 12.0–15.0)
Lymphocytes Relative: 22 % (ref 12–46)
Lymphs Abs: 1.3 10*3/uL (ref 0.7–4.0)
MCH: 32.6 pg (ref 26.0–34.0)
MCHC: 32.7 g/dL (ref 30.0–36.0)
MCV: 99.5 fL (ref 78.0–100.0)
Monocytes Absolute: 0.5 10*3/uL (ref 0.1–1.0)
Monocytes Relative: 9 % (ref 3–12)
NEUTROS ABS: 3.9 10*3/uL (ref 1.7–7.7)
NEUTROS PCT: 67 % (ref 43–77)
Platelets: 184 10*3/uL (ref 150–400)
RBC: 3.9 MIL/uL (ref 3.87–5.11)
RDW: 13.4 % (ref 11.5–15.5)
WBC: 5.7 10*3/uL (ref 4.0–10.5)

## 2014-05-15 LAB — COMPREHENSIVE METABOLIC PANEL
ALK PHOS: 98 U/L (ref 39–117)
ALT: 8 U/L (ref 0–35)
ANION GAP: 11 (ref 5–15)
AST: 14 U/L (ref 0–37)
Albumin: 3.6 g/dL (ref 3.5–5.2)
BUN: 25 mg/dL — AB (ref 6–23)
CO2: 24 meq/L (ref 19–32)
Calcium: 10.1 mg/dL (ref 8.4–10.5)
Chloride: 106 mEq/L (ref 96–112)
Creatinine, Ser: 0.96 mg/dL (ref 0.50–1.10)
GFR, EST AFRICAN AMERICAN: 57 mL/min — AB (ref >90)
GFR, EST NON AFRICAN AMERICAN: 49 mL/min — AB (ref >90)
GLUCOSE: 92 mg/dL (ref 70–99)
Potassium: 4.6 mEq/L (ref 3.7–5.3)
SODIUM: 141 meq/L (ref 137–147)
Total Bilirubin: 0.5 mg/dL (ref 0.3–1.2)
Total Protein: 6.9 g/dL (ref 6.0–8.3)

## 2014-05-15 LAB — CREATININE, SERUM
Creatinine, Ser: 0.87 mg/dL (ref 0.50–1.10)
GFR calc Af Amer: 64 mL/min — ABNORMAL LOW (ref >90)
GFR, EST NON AFRICAN AMERICAN: 55 mL/min — AB (ref >90)

## 2014-05-15 LAB — URINALYSIS, ROUTINE W REFLEX MICROSCOPIC
BILIRUBIN URINE: NEGATIVE
Glucose, UA: NEGATIVE mg/dL
Hgb urine dipstick: NEGATIVE
Ketones, ur: NEGATIVE mg/dL
NITRITE: NEGATIVE
PROTEIN: NEGATIVE mg/dL
Specific Gravity, Urine: 1.022 (ref 1.005–1.030)
UROBILINOGEN UA: 1 mg/dL (ref 0.0–1.0)
pH: 6.5 (ref 5.0–8.0)

## 2014-05-15 LAB — GLUCOSE, CAPILLARY: Glucose-Capillary: 89 mg/dL (ref 70–99)

## 2014-05-15 LAB — URINE MICROSCOPIC-ADD ON

## 2014-05-15 MED ORDER — ACETAMINOPHEN 325 MG PO TABS
650.0000 mg | ORAL_TABLET | ORAL | Status: DC | PRN
  Administered 2014-05-18: 650 mg via ORAL
  Filled 2014-05-15: qty 2

## 2014-05-15 MED ORDER — DEXTROSE 5 % IV SOLN
1.0000 g | Freq: Once | INTRAVENOUS | Status: AC
  Administered 2014-05-15: 1 g via INTRAVENOUS
  Filled 2014-05-15: qty 10

## 2014-05-15 MED ORDER — STROKE: EARLY STAGES OF RECOVERY BOOK
Freq: Once | Status: AC
  Administered 2014-05-15: 15:00:00
  Filled 2014-05-15: qty 1

## 2014-05-15 MED ORDER — POLYETHYLENE GLYCOL 3350 17 G PO PACK
17.0000 g | PACK | Freq: Every day | ORAL | Status: DC
Start: 2014-05-15 — End: 2014-05-21
  Administered 2014-05-15 – 2014-05-20 (×6): 17 g via ORAL
  Filled 2014-05-15 (×6): qty 1

## 2014-05-15 MED ORDER — ICAPS AREDS FORMULA PO TABS: ORAL_TABLET | Freq: Two times a day (BID) | ORAL | Status: DC

## 2014-05-15 MED ORDER — SODIUM CHLORIDE 0.9 % IV BOLUS (SEPSIS)
1000.0000 mL | Freq: Once | INTRAVENOUS | Status: AC
  Administered 2014-05-15: 1000 mL via INTRAVENOUS

## 2014-05-15 MED ORDER — FERROUS SULFATE 325 (65 FE) MG PO TABS
325.0000 mg | ORAL_TABLET | Freq: Every day | ORAL | Status: DC
  Administered 2014-05-16 – 2014-05-20 (×5): 325 mg via ORAL
  Filled 2014-05-15 (×5): qty 1

## 2014-05-15 MED ORDER — ENSURE PO LIQD: 1.0000 | Freq: Every day | ORAL | Status: DC

## 2014-05-15 MED ORDER — SERTRALINE HCL 50 MG PO TABS
75.0000 mg | ORAL_TABLET | Freq: Every day | ORAL | Status: DC
  Administered 2014-05-15: 75 mg via ORAL
  Administered 2014-05-16: 12:00:00 via ORAL
  Administered 2014-05-17: 100 mg via ORAL
  Administered 2014-05-18 – 2014-05-20 (×3): 75 mg via ORAL
  Filled 2014-05-15 (×6): qty 2

## 2014-05-15 MED ORDER — PANTOPRAZOLE SODIUM 40 MG PO TBEC
40.0000 mg | DELAYED_RELEASE_TABLET | Freq: Every day | ORAL | Status: DC
  Administered 2014-05-15 – 2014-05-20 (×6): 40 mg via ORAL
  Filled 2014-05-15 (×6): qty 1

## 2014-05-15 MED ORDER — ENSURE COMPLETE PO LIQD
237.0000 mL | Freq: Every day | ORAL | Status: DC
Start: 2014-05-15 — End: 2014-05-21
  Administered 2014-05-15 – 2014-05-20 (×5): 237 mL via ORAL

## 2014-05-15 MED ORDER — OCUVITE-LUTEIN PO CAPS
1.0000 | ORAL_CAPSULE | Freq: Two times a day (BID) | ORAL | Status: DC
  Administered 2014-05-15 – 2014-05-20 (×10): 1 via ORAL
  Filled 2014-05-15 (×11): qty 1

## 2014-05-15 MED ORDER — TRAMADOL HCL 50 MG PO TABS
50.0000 mg | ORAL_TABLET | Freq: Four times a day (QID) | ORAL | Status: DC | PRN
  Administered 2014-05-15 – 2014-05-20 (×5): 50 mg via ORAL
  Filled 2014-05-15 (×5): qty 1

## 2014-05-15 MED ORDER — LEVOTHYROXINE SODIUM 25 MCG PO TABS
25.0000 ug | ORAL_TABLET | Freq: Every day | ORAL | Status: DC
  Administered 2014-05-16 – 2014-05-20 (×5): 25 ug via ORAL
  Filled 2014-05-15 (×5): qty 1

## 2014-05-15 MED ORDER — HEPARIN SODIUM (PORCINE) 5000 UNIT/ML IJ SOLN
5000.0000 [IU] | Freq: Three times a day (TID) | INTRAMUSCULAR | Status: DC
  Administered 2014-05-15 – 2014-05-20 (×16): 5000 [IU] via SUBCUTANEOUS
  Filled 2014-05-15 (×16): qty 1

## 2014-05-15 MED ORDER — DONEPEZIL HCL 10 MG PO TABS
10.0000 mg | ORAL_TABLET | Freq: Every day | ORAL | Status: DC
  Administered 2014-05-15 – 2014-05-19 (×5): 10 mg via ORAL
  Filled 2014-05-15 (×5): qty 1

## 2014-05-15 MED ORDER — ASPIRIN 325 MG PO TABS
325.0000 mg | ORAL_TABLET | Freq: Every day | ORAL | Status: DC
  Administered 2014-05-15 – 2014-05-20 (×6): 325 mg via ORAL
  Filled 2014-05-15 (×6): qty 1

## 2014-05-15 NOTE — Progress Notes (Signed)
Pt received from ED at 14: 49pm alert, verbal with confusion noted. Unable to determine baseline.  No noted distress.  Observed weakness.  She is able to follow simple commands responds to pain  c/o right hip discomfort. Pt repositioned for comfort in bed. Pt oriented and educated to room. Safety measures in place. Call bell within reach.  Received report from [place], Charity fundraiserN. Pt is to remain on bedrest per MD call to this nurse until xray. Will continue to monitor.

## 2014-05-15 NOTE — Progress Notes (Signed)
Patient came back from MRI without the procedure being finished. Patient was very anxious and complained of soreness in right shoulder. Medicated with Ultram as ordered. Physician paged.

## 2014-05-15 NOTE — ED Provider Notes (Signed)
CSN: [id number]     Arrival date & time 05/15/14  [id number] History   First MD Initiated Contact with Patient 05/15/14 0750     Chief Complaint  Patient presents with  . Weakness     (Consider location/radiation/quality/duration/timing/severity/associated sxs/prior Treatment) Patient is a 80 y.o. female presenting with weakness.  Weakness This is a new problem. The current episode started yesterday. The problem occurs constantly. The problem has not changed since onset.Pertinent negatives include no chest pain, no abdominal pain, no headaches and no shortness of breath. Nothing aggravates the symptoms. Nothing relieves the symptoms.    Past Medical History  Diagnosis Date  . Dementia   . Hypertension   . GI bleed   . DVT (deep vein thrombosis) in pregnancy   . Dizziness   . Left bundle branch block    Past Surgical History  Procedure Laterality Date  . Abdominal hysterectomy     Family History  Problem Relation Age of Onset  . Hypertension Other    History  Substance Use Topics  . Smoking status: Never Smoker   . Smokeless tobacco: Not on file  . Alcohol Use: No   OB History    No data available     Review of Systems  Unable to perform ROS: Dementia  Respiratory: Negative for shortness of breath.   Cardiovascular: Negative for chest pain.  Gastrointestinal: Negative for abdominal pain.  Neurological: Positive for weakness. Negative for headaches.      Allergies  Penicillins  Home Medications   Prior to Admission medications   Medication Sig Start Date End Date Taking? Authorizing Provider  acetaminophen (TYLENOL) 500 MG tablet Take 500 mg by mouth 3 (three) times daily as needed for pain (takes three times a day and as needed).    Yes Historical Provider, MD  aspirin EC 81 MG tablet Take 81 mg by mouth daily.   Yes Historical Provider, MD  Calcium Carbonate-Vitamin D (OYSTER SHELL CALCIUM 500 + D PO) Take 1 tablet by mouth daily.   Yes Historical Provider, MD   donepezil (ARICEPT) 10 MG tablet Take 10 mg by mouth at bedtime.   Yes Historical Provider, MD  ENSURE (ENSURE) Take 1 Can by mouth daily. chocolate   Yes Historical Provider, MD  ferrous sulfate 325 (65 FE) MG tablet Take 325 mg by mouth daily with breakfast.    Yes Historical Provider, MD  HYDROcodone-acetaminophen (NORCO/VICODIN) 5-325 MG per tablet Take 1 tablet by mouth every 4 (four) hours as needed for moderate pain. 12/08/13  Yes Rolan BuccoMelanie Belfi, MD  levothyroxine (SYNTHROID, LEVOTHROID) 25 MCG tablet Take 25 mcg by mouth every morning.     Yes Historical Provider, MD  Multiple Vitamins-Minerals (ICAPS AREDS FORMULA PO) Take 1 capsule by mouth 2 (two) times daily.   Yes Historical Provider, MD  omeprazole (PRILOSEC) 20 MG capsule Take 20 mg by mouth daily.   Yes Historical Provider, MD  polyethylene glycol (MIRALAX / GLYCOLAX) packet Take 17 g by mouth daily. For constipation   Yes Historical Provider, MD  sertraline (ZOLOFT) 50 MG tablet Take 75 mg by mouth daily.    Yes Historical Provider, MD  traMADol (ULTRAM) 50 MG tablet Take 50 mg by mouth every 6 (six) hours as needed for pain.   Yes Historical Provider, MD   BP 182/75 mmHg  Pulse 58  Temp(Src) 97.5 F (36.4 C) (Oral)  Resp 24  SpO2 94% Physical Exam  Constitutional: She appears well-developed and well-nourished.  HENT:  Head: Normocephalic  and atraumatic.  Right Ear: External ear normal.  Left Ear: External ear normal.  Eyes: Conjunctivae and EOM are normal. Pupils are equal, round, and reactive to light.  Neck: Normal range of motion. Neck supple.  Cardiovascular: Normal rate, regular rhythm and intact distal pulses.   Murmur heard.  Systolic murmur is present with a grade of 3/6  Pulmonary/Chest: Effort normal and breath sounds normal.  Abdominal: Soft. Bowel sounds are normal. There is no tenderness.  Musculoskeletal: Normal range of motion.  Neurological: She is alert. No cranial nerve deficit or sensory deficit.   Strength 4/5 on L, 4+/5 on right  Skin: Skin is warm and dry.  Vitals reviewed.   ED Course  Procedures (including critical care time) Labs Review Labs Reviewed  COMPREHENSIVE METABOLIC PANEL - Abnormal; Notable for the following:    BUN 25 (*)    GFR calc non Af Amer 49 (*)    GFR calc Af Amer 57 (*)    All other components within normal limits  URINALYSIS, ROUTINE W REFLEX MICROSCOPIC - Abnormal; Notable for the following:    APPearance CLOUDY (*)    Leukocytes, UA SMALL (*)    All other components within normal limits  URINE MICROSCOPIC-ADD ON - Abnormal; Notable for the following:    Squamous Epithelial / LPF FEW (*)    All other components within normal limits  CBC WITH DIFFERENTIAL  TROPONIN I    Imaging Review Dg Chest 2 View  05/15/2014   CLINICAL DATA:  Weakness  EXAM: CHEST  2 VIEW  COMPARISON:  04/29/2014  FINDINGS: Heart size upper normal. Negative for heart failure. Lungs are clear without infiltrate or effusion. Atherosclerotic disease in the aortic arch  Advanced degenerative change in the right shoulder joint rotator cuff tear and prior rotator cuff repair. Degenerative change in the left shoulder.  IMPRESSION: No active cardiopulmonary disease.   Electronically Signed   By: Marlan Palau M.D.   On: 05/15/2014 08:51   Ct Head Wo Contrast  05/15/2014   CLINICAL DATA:  Altered mental status  EXAM: CT HEAD WITHOUT CONTRAST  TECHNIQUE: Contiguous axial images were obtained from the base of the skull through the vertex without intravenous contrast.  COMPARISON:  April 21, 2013  FINDINGS: There is moderate diffuse atrophy. Is no intracranial mass, hemorrhage, extra-axial fluid collection, or midline shift. There is small vessel disease throughout the centra semiovale bilaterally. Small vessel disease is noted in both posterior limbs of the internal capsules. There are small infarcts in the medial right thalamus. Small vessel disease is also noted in the left thalamus.  No acute appearing infarct is seen on this study.  Bones are osteoporotic. The bony calvarium appears intact. The mastoid air cells are clear. There is a calcium containing sebaceous cyst over the right occipital bone measuring 1.9 x 1.3 cm.  IMPRESSION: Atrophy with prior small infarcts and extensive supratentorial small vessel disease. No intracranial mass, hemorrhage, or acute appearing infarct. Stable sebaceous cyst containing calcification, right occipital area.   Electronically Signed   By: Bretta Bang M.D.   On: 05/15/2014 09:07     EKG Interpretation   Date/Time:  Thursday May 15 2014 08:19:42 EST Ventricular Rate:  52 PR Interval:  171 QRS Duration: 153 QT Interval:  466 QTC Calculation: 433 R Axis:   -49 Text Interpretation:  Sinus rhythm Left bundle branch block new t wave  inversions in v5 Confirmed by Mirian Mo [phone]) on 05/15/2014  8:50:43 AM  MDM   Final diagnoses:  Weakness  Generalized weakness    80 y.o. female with pertinent PMH of dementia, chronic R shoulder pain presents with Generalized weakness.  No other focal neuro deficits.  On speaking with nursing home staff, the pt had an episode of confusion and inability to speak last night which resolved, but she maintained generalized weakness.  No fevers or GI symptoms reported.  On arrival to the ED the patient has an exam as above. She is mildly bradycardic at times, however not on my examination.  Her neuro exam is significant for decreased strength bilaterally, however this is worse in the left. Obtained CT scan and workup which was significant only for signs of a potentially very mild urinary tract infection. Do not think that this is the etiology of her symptoms. Concern for TIA versus ongoing CVA symptoms. Consulted hospitalist for admission.  I have reviewed all laboratory and imaging studies if ordered as above  1. Weakness   2. Generalized weakness   3. Hip pain         Mirian MoMatthew  Isolde Skaff, MD 05/15/14 [phone]

## 2014-05-15 NOTE — H&P (Addendum)
PATIENT DETAILS Name: Rebecca Dawson Age: 80 y.o. Sex: female Date of Birth: 08/18/1917 Admit Date: 05/15/2014 ZOX:[id number],HAL THOMAS, MD   CHIEF COMPLAINT:  Weakness Transient confusion  HPI: Rebecca Dawson is a 80 y.o. female with a Past Medical History of dementia, chronic back pain, history of venous thromboembolism currently not in Coumadin, hypothyroidism who presents today with the above noted complaint. Please note, most of this history is obtained from the ED chart, patient is mildly confused. Apparently patient is a resident of a local assisted living facility, [facility] because of weakness and then baseline. Apparently, per ED M.D., patient was noted to be transiently confused yesterday, and was noted by the ALF staff to be leaning more towards her left. She was found to have more generalized weakness that usual.During my evaluation, patient was mostly alert, she was able to answer most of my questions appropriately, however was not able to tell me exactly why she was here in the emergency room. She is complaining of some bilateral hip pain. Denies any shortness of breath or chest pain. CT of the head done in the emergency room was negative for acute abnormalities, since there was a question of TIA, and inability of the patient to walk, I was asked to admit this patient for further evaluation and treatment.   ALLERGIES:   Allergies  Allergen Reactions  . Penicillins     Unknown reaction.    PAST MEDICAL HISTORY: Past Medical History  Diagnosis Date  . Dementia   . Hypertension   . GI bleed   . DVT (deep vein thrombosis) in pregnancy   . Dizziness   . Left bundle branch block     PAST SURGICAL HISTORY: Past Surgical History  Procedure Laterality Date  . Abdominal hysterectomy      MEDICATIONS AT HOME: Prior to Admission medications   Medication Sig Start Date End Date Taking? Authorizing Provider   acetaminophen (TYLENOL) 500 MG tablet Take 500 mg by mouth 3 (three) times daily as needed for pain (takes three times a day and as needed).    Yes Historical Provider, MD  aspirin EC 81 MG tablet Take 81 mg by mouth daily.   Yes Historical Provider, MD  Calcium Carbonate-Vitamin D (OYSTER SHELL CALCIUM 500 + D PO) Take 1 tablet by mouth daily.   Yes Historical Provider, MD  donepezil (ARICEPT) 10 MG tablet Take 10 mg by mouth at bedtime.   Yes Historical Provider, MD  ENSURE (ENSURE) Take 1 Can by mouth daily. chocolate   Yes Historical Provider, MD  ferrous sulfate 325 (65 FE) MG tablet Take 325 mg by mouth daily with breakfast.    Yes Historical Provider, MD  HYDROcodone-acetaminophen (NORCO/VICODIN) 5-325 MG per tablet Take 1 tablet by mouth every 4 (four) hours as needed for moderate pain. 12/08/13  Yes Rolan BuccoMelanie Belfi, MD  levothyroxine (SYNTHROID, LEVOTHROID) 25 MCG tablet Take 25 mcg by mouth every morning.     Yes Historical Provider, MD  Multiple Vitamins-Minerals (ICAPS AREDS FORMULA PO) Take 1 capsule by mouth 2 (two) times daily.   Yes Historical Provider, MD  omeprazole (PRILOSEC) 20 MG capsule Take 20 mg by mouth daily.   Yes Historical Provider, MD  polyethylene glycol (MIRALAX / GLYCOLAX) packet Take 17 g by mouth daily. For constipation   Yes Historical Provider, MD  sertraline (ZOLOFT) 50 MG tablet Take 75 mg by mouth daily.  Yes Historical Provider, MD  traMADol (ULTRAM) 50 MG tablet Take 50 mg by mouth every 6 (six) hours as needed for pain.   Yes Historical Provider, MD    FAMILY HISTORY: Family History  Problem Relation Age of Onset  . Hypertension Other    SOCIAL HISTORY:  reports that she has never smoked. She does not have any smokeless tobacco history on file. She reports that she does not drink alcohol or use illicit drugs.  REVIEW OF SYSTEMS:  Constitutional:   No  weight loss, night sweats,  Fevers, chills, fatigue.  HEENT:    No headaches, Difficulty  swallowing,Tooth/dental problems,Sore throat,   Cardio-vascular: No chest pain,  Orthopnea, PND, swelling in lower extremities, anasarca.  GI:  No heartburn, indigestion, abdominal pain, nausea, vomiting, diarrhea.  Resp: No shortness of breath with exertion or at rest.  No excess mucus, no productive cough, No non-productive cough,    Skin:  no rash or lesions.  GU:  no dysuria, change in color of urine, no urgency or frequency.  No flank pain.  Musculoskeletal: No joint pain or swelling.  No decreased range of motion.  No back pain.  Psych: No change in mood or affect. No depression or anxiety.  No memory loss.  PHYSICAL EXAM: Blood pressure 154/70, pulse 61, temperature 97.5 F (36.4 C), temperature source Oral, resp. rate 18, SpO2 98 %.  General appearance :Awake, mostly alert, not in any distress. Speech Clear. Not toxic Looking HEENT: Atraumatic and Normocephalic, pupils equally reactive to light and accomodation Neck: supple, no JVD. No cervical lymphadenopathy.  Chest:Good air entry bilaterally, no added sounds  CVS: S1 S2 regular, 2/6 syst murmur.  Abdomen: Bowel sounds present, Non tender and not distended with no gaurding, rigidity or rebound. Extremities: B/L Lower Ext shows no edema, both legs are warm to touch Neurology: Non focal Skin:No Rash Wounds:N/A  LABS ON ADMISSION:   Recent Labs  05/15/14 0900  NA 141  K 4.6  CL 106  CO2 24  GLUCOSE 92  BUN 25*  CREATININE 0.96  CALCIUM 10.1    Recent Labs  05/15/14 0900  AST 14  ALT 8  ALKPHOS 98  BILITOT 0.5  PROT 6.9  ALBUMIN 3.6   No results for input(s): LIPASE, AMYLASE in the last 72 hours.  Recent Labs  05/15/14 0900  WBC 5.7  NEUTROABS 3.9  HGB 12.7  HCT 38.8  MCV 99.5  PLT 184    Recent Labs  05/15/14 0900  TROPONINI <0.30   No results for input(s): DDIMER in the last 72 hours. Invalid input(s): POCBNP   RADIOLOGIC STUDIES ON ADMISSION: Dg Chest 2 View  05/15/2014    CLINICAL DATA:  Weakness  EXAM: CHEST  2 VIEW  COMPARISON:  04/29/2014  FINDINGS: Heart size upper normal. Negative for heart failure. Lungs are clear without infiltrate or effusion. Atherosclerotic disease in the aortic arch  Advanced degenerative change in the right shoulder joint rotator cuff tear and prior rotator cuff repair. Degenerative change in the left shoulder.  IMPRESSION: No active cardiopulmonary disease.   Electronically Signed   By: Marlan Palauharles  Clark M.D.   On: 05/15/2014 08:51   Ct Head Wo Contrast  05/15/2014   CLINICAL DATA:  Altered mental status  EXAM: CT HEAD WITHOUT CONTRAST  TECHNIQUE: Contiguous axial images were obtained from the base of the skull through the vertex without intravenous contrast.  COMPARISON:  April 21, 2013  FINDINGS: There is moderate diffuse atrophy. Is no intracranial  mass, hemorrhage, extra-axial fluid collection, or midline shift. There is small vessel disease throughout the centra semiovale bilaterally. Small vessel disease is noted in both posterior limbs of the internal capsules. There are small infarcts in the medial right thalamus. Small vessel disease is also noted in the left thalamus. No acute appearing infarct is seen on this study.  Bones are osteoporotic. The bony calvarium appears intact. The mastoid air cells are clear. There is a calcium containing sebaceous cyst over the right occipital bone measuring 1.9 x 1.3 cm.  IMPRESSION: Atrophy with prior small infarcts and extensive supratentorial small vessel disease. No intracranial mass, hemorrhage, or acute appearing infarct. Stable sebaceous cyst containing calcification, right occipital area.   Electronically Signed   By: Bretta Bang M.D.   On: 05/15/2014 09:07   EKG: Independently reviewed. LBBB (old)  ASSESSMENT AND PLAN: Present on Admission:  . ?TIA (transient ischemic attack): Not sure if any of the symptoms attributable to TIA at this point. However per ED staff confusion was  transient and she apparently was leaning to her left-completely non-focal exam. Obtain MRI brain (family ok with this-"want to know"), not sure if getting an echocardiogram or carotid Dopplers will change management-therefore will not pursue further work up. Continue aspirin. Physical therapy evaluation. Spoke with patient's Levora Dredge [phone] agrees not to commence any further workup, does not want any heroic measures. Wants hospice/palliative care to follow patient on discharge  . Hypothyroidism: Continue levothyroxine   . Dementia: Seems stable, mildly confused-able to answer most of my questions appropriately. Not sure of the transient confusion yesterday was related to delirium from dementia. Continue Aricept  . Hip pain, bilateral: Check pelvic x-ray. From what I can tell there is no history of fall. PT evaluation. Pain control with tramadol.  Further plan will depend as patient's clinical course evolves and further radiologic and laboratory data become available. Patient will be monitored closely.  Above noted plan was discussed with son-Rebecca Dawson,he was in agreement.   DVT Prophylaxis: Prophylactic Heparin  Code Status: DNR-reconfirmed with son over the phone  Disposition Plan: may need SNF on discharge  Total time spent for admission equals 45 minutes.  [facility] Pager [phone]  If 7PM-7AM, please contact night-coverage [email or web address] Password [email or web address] 05/15/2014, 12:51 PM

## 2014-05-15 NOTE — ED Notes (Signed)
Patient to ct and xray at this time

## 2014-05-15 NOTE — Progress Notes (Signed)
XRay hip-possible left femoral neck fracture-spoke with Radiology-recommended MRI-have ordered-bed rest for now. Spoke with patient's son over the phone-he is aware of falls but those were a month back. Will discuss options post MRI-family is open to hospice/palliative care.

## 2014-05-15 NOTE — Progress Notes (Signed)
Pt  Just left for MRI.

## 2014-05-15 NOTE — ED Notes (Signed)
Rebecca PaymentMike Ross (son) [phone] to be notified when patients either goes back to facility or is admitted.

## 2014-05-15 NOTE — ED Notes (Signed)
Facility reports patient was feeling weaker than normal last night, patient given pain meds by facility to make her feel more comfortable, this am patient still weaker than normal so sent here for eval,

## 2014-05-16 ENCOUNTER — Observation Stay (HOSPITAL_COMMUNITY): Payer: Medicare Other

## 2014-05-16 DIAGNOSIS — F0390 Unspecified dementia without behavioral disturbance: Secondary | ICD-10-CM

## 2014-05-16 DIAGNOSIS — Z66 Do not resuscitate: Secondary | ICD-10-CM | POA: Diagnosis present

## 2014-05-16 DIAGNOSIS — Z515 Encounter for palliative care: Secondary | ICD-10-CM | POA: Diagnosis not present

## 2014-05-16 DIAGNOSIS — Z7982 Long term (current) use of aspirin: Secondary | ICD-10-CM | POA: Diagnosis not present

## 2014-05-16 DIAGNOSIS — S3210XA Unspecified fracture of sacrum, initial encounter for closed fracture: Secondary | ICD-10-CM | POA: Diagnosis present

## 2014-05-16 DIAGNOSIS — Z86718 Personal history of other venous thrombosis and embolism: Secondary | ICD-10-CM | POA: Diagnosis not present

## 2014-05-16 DIAGNOSIS — S32591A Other specified fracture of right pubis, initial encounter for closed fracture: Secondary | ICD-10-CM | POA: Diagnosis present

## 2014-05-16 DIAGNOSIS — I10 Essential (primary) hypertension: Secondary | ICD-10-CM | POA: Diagnosis present

## 2014-05-16 DIAGNOSIS — I447 Left bundle-branch block, unspecified: Secondary | ICD-10-CM | POA: Diagnosis present

## 2014-05-16 DIAGNOSIS — R531 Weakness: Secondary | ICD-10-CM | POA: Diagnosis present

## 2014-05-16 DIAGNOSIS — I639 Cerebral infarction, unspecified: Secondary | ICD-10-CM | POA: Diagnosis present

## 2014-05-16 DIAGNOSIS — G8929 Other chronic pain: Secondary | ICD-10-CM | POA: Diagnosis present

## 2014-05-16 DIAGNOSIS — E039 Hypothyroidism, unspecified: Secondary | ICD-10-CM | POA: Diagnosis present

## 2014-05-16 DIAGNOSIS — Z880 Allergy status to penicillin: Secondary | ICD-10-CM | POA: Diagnosis not present

## 2014-05-16 DIAGNOSIS — M549 Dorsalgia, unspecified: Secondary | ICD-10-CM | POA: Diagnosis present

## 2014-05-16 LAB — HEMOGLOBIN A1C
Hgb A1c MFr Bld: 5.5 % (ref <5.7)
Mean Plasma Glucose: 111 mg/dL (ref <117)

## 2014-05-16 LAB — GLUCOSE, CAPILLARY
GLUCOSE-CAPILLARY: 92 mg/dL (ref 70–99)
Glucose-Capillary: 98 mg/dL (ref 70–99)
Glucose-Capillary: 96 mg/dL (ref 70–99)

## 2014-05-16 LAB — LIPID PANEL
CHOL/HDL RATIO: 7.1 ratio
CHOLESTEROL: 263 mg/dL — AB (ref 0–200)
HDL: 37 mg/dL — AB (ref >39)
LDL Cholesterol: 179 mg/dL — ABNORMAL HIGH (ref 0–99)
TRIGLYCERIDES: 234 mg/dL — AB (ref <150)
VLDL: 47 mg/dL — ABNORMAL HIGH (ref 0–40)

## 2014-05-16 MED ORDER — LORAZEPAM 2 MG/ML IJ SOLN
1.0000 mg | Freq: Once | INTRAMUSCULAR | Status: AC
  Administered 2014-05-16: 1 mg via INTRAVENOUS
  Filled 2014-05-16: qty 1

## 2014-05-16 NOTE — Progress Notes (Signed)
Rehab Admissions Coordinator Note:  Patient was screened by Clois DupesBoyette, Augustus Zurawski Godwin for appropriateness for an Inpatient Acute Rehab Consult per PT recommendation.  At this time, we are recommending Skilled Nursing Facility. Pt currently not at a level to be able to tolerate intense therapies. I will follow her progress.  Clois DupesBoyette, Meeya Goldin Godwin 05/16/2014, 4:25 PM  I can be reached at [phone].

## 2014-05-16 NOTE — Evaluation (Signed)
Physical Therapy Evaluation Patient Details Name: Rebecca Dawson MRN: [id number] DOB: 05/23/1918 Today's Date: 05/16/2014   History of Present Illness  80 YO female with extremely lethargic presentation who was admitted for increased confuion and now is diagnosed with TIA.    Clinical Impression  Pt was seen for evaluation of her physical function and demonstrates very poor control of sitting and ability to tolerate movement.  Plan is to explore inpt therapy in CIR as she is quite unable to tolerate enough activity to succeed in her AL environment.    Follow Up Recommendations CIR    Equipment Recommendations  None recommended by PT    Recommendations for Other Services Rehab consult     Precautions / Restrictions Precautions Precautions: Fall Restrictions Weight Bearing Restrictions: No      Mobility  Bed Mobility Overal bed mobility: Needs Assistance Bed Mobility: Supine to Sit;Sit to Supine     Supine to sit: Max assist Sit to supine: Max assist   General bed mobility comments: pt did not make much effort to contribute, and controlled actual sittting very little and resisted sitting  Transfers Overall transfer level: Needs assistance Equipment used: None;1 person hand held assist Transfers: Sit to/from Stand Sit to Stand: Total assist         General transfer comment: Pt cannot tolerate attempts to stand  Ambulation/Gait             General Gait Details: unable  Stairs            Wheelchair Mobility    Modified Rankin (Stroke Patients Only)       Balance Overall balance assessment: Needs assistance Sitting-balance support: Feet supported;Bilateral upper extremity supported Sitting balance-Leahy Scale: Poor       Standing balance-Leahy Scale: Zero                               Pertinent Vitals/Pain Pain Assessment: Faces Faces Pain Scale: Hurts little more Pain Location: unclear Pain Intervention(s): Limited  activity within patient's tolerance;Monitored during session;Repositioned    Home Living Family/patient expects to be discharged to:: Assisted living               Home Equipment:  (cannot get information from pt)      Prior Function Level of Independence: Needs assistance   Gait / Transfers Assistance Needed: incomplete information  ADL's / Homemaking Assistance Needed: assisted living        Hand Dominance        Extremity/Trunk Assessment   Upper Extremity Assessment: Generalized weakness           Lower Extremity Assessment: Generalized weakness      Cervical / Trunk Assessment: Kyphotic  Communication   Communication: Other (comment) (speech is low in volume and unclear)  Cognition Arousal/Alertness: Lethargic Behavior During Therapy: Anxious;Restless Overall Cognitive Status: Difficult to assess       Memory: Decreased recall of precautions              General Comments General comments (skin integrity, edema, etc.): Pt asks to get back to bed after having a difficult time tolerating sitting and with poor response to activity     Exercises        Assessment/Plan    PT Assessment Patient needs continued PT services  PT Diagnosis Generalized weakness   PT Problem List Decreased strength;Decreased range of motion;Decreased activity tolerance;Decreased balance;Decreased mobility;Decreased coordination;Decreased cognition;Decreased knowledge of  use of DME;Decreased safety awareness;Decreased skin integrity  PT Treatment Interventions DME instruction;Functional mobility training;Therapeutic activities;Therapeutic exercise;Balance training;Neuromuscular re-education;Cognitive remediation;Patient/family education   PT Goals (Current goals can be found in the Care Plan section) Acute Rehab PT Goals Patient Stated Goal: did not state PT Goal Formulation: With patient Time For Goal Achievement: 05/30/14 Potential to Achieve Goals: Fair     Frequency Min 3X/week   Barriers to discharge Other (comment) (too weak for AL level of care)      Co-evaluation               End of Session Equipment Utilized During Treatment:  (none) Activity Tolerance: Patient limited by lethargy Patient left: in bed;with call bell/phone within reach;with bed alarm set Nurse Communication: Mobility status         Time: [id number] PT Time Calculation (min) (ACUTE ONLY): 30 min   Charges:   PT Evaluation $Initial PT Evaluation Tier I: 1 Procedure PT Treatments $Therapeutic Activity: 8-22 mins   PT G Codes:          Ivar DrapeStout, Braelee Herrle E 05/16/2014, 2:03 PM   Samul Dadauth Yazlyn Wentzel, PT MS Acute Rehab Dept. Number: [phone]

## 2014-05-16 NOTE — Progress Notes (Signed)
[facility] PROGRESS NOTE  Rebecca Dawson ZOX:[id number] DOB: 07-24-1917 DOA: 05/15/2014  PCP: Ginette Otto, MD  Brief HPI: 80 year old white female with a past mental history of dementia, chronic back pain, hypothyroidism, who was admitted due to complaints of confusion and possible left-sided weakness. She also is complaining of for hip pain. She is found to have an acute stroke and sacral fractures. Discussed with the patient's son, who does not want further workup and aggressive treatments. So hospice has been consulted.  Past medical history:  Past Medical History  Diagnosis Date  . Dementia   . Hypertension   . GI bleed   . DVT (deep vein thrombosis) in pregnancy   . Dizziness   . Left bundle branch block     Consultants: Palliative care  Procedures: None  Antibiotics: Given one dose of ceftriaxone in the ED  Subjective: Patient is sedated. She was given Ativan earlier. Responds only to painful stimuli.  Objective: Vital Signs  Filed Vitals:   05/15/14 2130 05/15/14 2341 05/16/14 0047 05/16/14 0250  BP: 168/94 170/99 182/71 174/88  Pulse: 65 62 63 66  Temp: 97.6 F (36.4 C) 97.8 F (36.6 C) 97.3 F (36.3 C) 97.9 F (36.6 C)  TempSrc: Oral Oral Oral Oral  Resp: 18 17 17 18   Weight:      SpO2: 94% 92% 93% 94%    Intake/Output Summary (Last 24 hours) at 05/16/14 1138 Last data filed at 05/15/14 1831  Gross per 24 hour  Intake    120 ml  Output      0 ml  Net    120 ml   Filed Weights   05/15/14 1330  Weight: 62.642 kg (138 lb 1.6 oz)    General appearance: Sedated Head: Normocephalic, without obvious abnormality, atraumatic Resp: Decreased air entry at the bases. No wheezing or rhonchi. Cardio: regular rate and rhythm, S1, S2 normal, no murmur, click, rub or gallop GI: soft, non-tender; bowel sounds normal; no masses,  no organomegaly Extremities: No tenderness was elicited with the mobilization of either hip. Skin: Skin color,  texture, turgor normal. No rashes or lesions Neurologic: No obvious facial symmetry. Unable to do neurological examination as the patient is uncooperative.  Lab Results:  Basic Metabolic Panel:  Recent Labs Lab 05/15/14 0900 05/15/14 1530  NA 141  --   K 4.6  --   CL 106  --   CO2 24  --   GLUCOSE 92  --   BUN 25*  --   CREATININE 0.96 0.87  CALCIUM 10.1  --    Liver Function Tests:  Recent Labs Lab 05/15/14 0900  AST 14  ALT 8  ALKPHOS 98  BILITOT 0.5  PROT 6.9  ALBUMIN 3.6   CBC:  Recent Labs Lab 05/15/14 0900 05/15/14 1530  WBC 5.7 5.6  NEUTROABS 3.9  --   HGB 12.7 12.8  HCT 38.8 40.0  MCV 99.5 99.5  PLT 184 207   Cardiac Enzymes:  Recent Labs Lab 05/15/14 0900  TROPONINI <0.30   BNP (last 3 results)  Recent Labs  04/29/14 1729  PROBNP 1296.0*   CBG:  Recent Labs Lab 05/15/14 1633 05/16/14 0620  GLUCAP 89 98     Studies/Results: Dg Chest 2 View  05/15/2014   CLINICAL DATA:  Weakness  EXAM: CHEST  2 VIEW  COMPARISON:  04/29/2014  FINDINGS: Heart size upper normal. Negative for heart failure. Lungs are clear without infiltrate or effusion. Atherosclerotic disease in the aortic  arch  Advanced degenerative change in the right shoulder joint rotator cuff tear and prior rotator cuff repair. Degenerative change in the left shoulder.  IMPRESSION: No active cardiopulmonary disease.   Electronically Signed   By: Marlan Palauharles  Clark M.D.   On: 05/15/2014 08:51   Dg Hip Bilateral W/pelvis  05/15/2014   CLINICAL DATA:  Weakness, pain in the hips  EXAM: BILATERAL HIP WITH PELVIS - 4+ VIEW  COMPARISON:  CT abdomen pelvis of 04/14/2011 and pelvis right hip films of 12/08/2013  FINDINGS: Old right pelvic rami fractures again are noted. The bones are diffusely osteopenic. No obvious hip fracture is seen. However on one view there is slight angulation of the cortex of the femoral head /neck superiorly and a subtle fracture would be difficult to exclude.  Recommend MRI of this region if further assessment is felt necessary clinically. There are degenerative changes present in the lower lumbar spine.  IMPRESSION: 1. No definite fracture but slight cortical irregularity of the head/neck of the left femur superiorly is suspicious for nondisplaced fracture, and MRI may be helpful if further assessment is warranted. 2. Diffuse osteopenia. 3. Old fracture deformity of the right pelvic ramus.   Electronically Signed   By: Dwyane DeePaul  Barry M.D.   On: 05/15/2014 15:07   Ct Head Wo Contrast  05/15/2014   CLINICAL DATA:  Altered mental status  EXAM: CT HEAD WITHOUT CONTRAST  TECHNIQUE: Contiguous axial images were obtained from the base of the skull through the vertex without intravenous contrast.  COMPARISON:  April 21, 2013  FINDINGS: There is moderate diffuse atrophy. Is no intracranial mass, hemorrhage, extra-axial fluid collection, or midline shift. There is small vessel disease throughout the centra semiovale bilaterally. Small vessel disease is noted in both posterior limbs of the internal capsules. There are small infarcts in the medial right thalamus. Small vessel disease is also noted in the left thalamus. No acute appearing infarct is seen on this study.  Bones are osteoporotic. The bony calvarium appears intact. The mastoid air cells are clear. There is a calcium containing sebaceous cyst over the right occipital bone measuring 1.9 x 1.3 cm.  IMPRESSION: Atrophy with prior small infarcts and extensive supratentorial small vessel disease. No intracranial mass, hemorrhage, or acute appearing infarct. Stable sebaceous cyst containing calcification, right occipital area.   Electronically Signed   By: Bretta BangWilliam  Woodruff M.D.   On: 05/15/2014 09:07   Mr Brain Wo Contrast  05/15/2014   CLINICAL DATA:  Transient contusion yesterday, leaning towards the LEFT, generalized weakness.  EXAM: MRI HEAD WITHOUT CONTRAST  TECHNIQUE: Multiplanar, multiecho pulse sequences of the  brain and surrounding structures were obtained without intravenous contrast. Patient was unable to complete the examination, axial T2 FLAIR, coronal T2, axial gradient imaging not obtained.  COMPARISON:  CT of the head May 15, 2014  FINDINGS: Subcentimeter foci of reduced diffusion within RIGHT caudate body, posterior limb of the RIGHT internal capsule and, RIGHT thalamus. Corresponding low ADC values.  Multiple remote RIGHT small cerebellar infarcts. Multiple remote bilateral basal ganglia lacunar infarcts. No midline shift or mass effect.  Moderate to severe ventriculomegaly, likely on the basis of global parenchymal brain volume loss as there is overall commensurate enlargement of cerebral sulci and cerebellar folia, unchanged from prior imaging. Moderate to severe white matter changes suggest chronic small vessel ischemic disease. No abnormal extra-axial fluid collections. Major intracranial vascular flow voids observed at the skull base color dolichoectatic appearance.  RIGHT suboccipital sebaceous cyst. No abnormal sellar expansion.  No cerebellar tonsillar ectopia. No suspicious calvarial bone marrow signal. LEFT occipital probable vascular leak. No paranasal sinus air-fluid levels. The mastoid air cells appear well-aerated.  IMPRESSION: Limited noncontrast MRI of the brain; patient was unable to complete the examination.  Acute subcentimeter lacunar infarcts RIGHT caudate, thalamus and internal capsule.  Involutional changes. Remote bilateral thalamus lacunar infarcts, small remote RIGHT cerebellar infarcts on this mildly motion degraded examination.  Moderate to severe white matter changes likely represent chronic small vessel ischemic disease.   Electronically Signed   By: Awilda Metroourtnay  Bloomer   On: 05/15/2014 23:39   [name]  05/16/2014   CLINICAL DATA:  Hip pain.  EXAM: MR OF THE LEFT HIP WITHOUT CONTRAST  TECHNIQUE: Multiplanar, multisequence MR imaging was performed. No intravenous  contrast was administered.  COMPARISON:  Radiographs dated 05/15/2014  FINDINGS: Bones: There is an acute or subacute transverse fracture through the third sacral segment with edema extending into the second and fourth sacral segments. The other pelvic bones are intact. Proximal femurs are intact. There are old healed fractures of the right inferior and superior pubic rami and right pubic body.  There are minimal degenerative changes of the anterior superior aspect of the left acetabulum. There small osteophytes on the acetabulum and left femoral head.  There is no bursitis or joint effusion. There similar findings on the right hip.  The muscles around the hips and pelvis are normal. No adenopathy. 3.3 cm cyst on the left ovary.  IMPRESSION: 1. Acute or subacute transverse fracture through the third sacral segment. No other acute osseous abnormalities. 2. 3.3 cm simple appearing cyst on the left ovary.   Electronically Signed   By: Geanie CooleyJim  Maxwell M.D.   On: 05/16/2014 11:04    Medications:  Scheduled: . aspirin  325 mg Oral Daily  . donepezil  10 mg Oral QHS  . feeding supplement (ENSURE COMPLETE)  237 mL Oral Daily  . ferrous sulfate  325 mg Oral Q breakfast  . heparin  5,000 Units Subcutaneous 3 times per day  . levothyroxine  25 mcg Oral QAC breakfast  . multivitamin-lutein  1 capsule Oral BID  . pantoprazole  40 mg Oral Daily  . polyethylene glycol  17 g Oral Daily  . sertraline  75 mg Oral Daily   Continuous:  GEX:[id number]:acetaminophen, traMADol  Assessment/Plan:  Principal Problem:   TIA (transient ischemic attack) Active Problems:   Hypothyroidism   Dementia   General weakness   Hip pain, bilateral   Acute CVA (cerebrovascular accident)    Acute stroke with Lachman or infarcts in the right caudate, thalamus, internal capsule There was mention of left-sided weakness in her assisted living facility. Though this was not noted conclusively yesterday at admission. This would likely explain  her symptoms to some extent. The findings were discussed with the patient's son. Considering her poor baseline quality of life and low level of functioning further testing will not alter her long-term prognosis. Hence, no further evaluation will be obtained. No need to consult neurology. Anticipate patient has less than 6 months to live. Son agrees with pursuing hospice care. We'll consult our palliative medicine team to pursue this further. Comfort will be the main goal. We will get a speech therapist to assess her swallow function  Bilateral hip pain. X-ray raised concern for possible fracture in the left hip. MRI was obtained which does not show such finding, however, does reveal sacral fractures. It could be acute or subacute. Patient does  have a history of chronic back pain. According to the son. She is following a few times in the last 1 month. This could have resulted in the fracture. But she is not a candidate for any aggressive intervention. Pain control will be the mainstay.  Hypothyroidism Continue levothyroxine if she is able to take orally  History of Dementia She is sedated due to Ativan. According to the son, she has been declining over the last 6 months with the worsening confusion and worsening physical activity. Continue Aricept for now.  DVT Prophylaxis: Leave her on heparin for now. May be discontinued depending on input from palliative medicine    Code Status: DO NOT RESUSCITATE  Family Communication: Discussed with the patient's son Deborha Payment at [phone]. Main goal is comfort. Disposition Plan: Ideally, she should go to residential hospice. But will wait to see what palliative medicine has to recommend.    LOS: 1 day   [facility]  [facility] Pager [phone] 05/16/2014, 11:38 AM  If 8PM-8AM, please contact night-coverage at [email or web address], password [facility]

## 2014-05-17 DIAGNOSIS — R531 Weakness: Secondary | ICD-10-CM

## 2014-05-17 NOTE — Progress Notes (Signed)
[facility] PROGRESS NOTE  Rebecca Dawson VHQ:[id number] DOB: 03/09/1918 DOA: 05/15/2014 PCP: Ginette OttoSTONEKING,HAL THOMAS, MD  Assessment/Plan: 1. Acute infarct to R caudate, thalamus, internal capsule 1. See previous note, family declines further stroke work up 2. Palliative care consulted, appreciate input in this unfortunate case 2. B hip pain 1. Sacral fractures seen on recent MRI 2. Not considered candidate for aggressive measures, thus conservative measures recommended 3. Hypothyroid 1. Cont on thyroid replacement as tolerated 4. Dementia 1. Stable 2. On aricept 5. DVT prophylaixs 1. Heparin  Code Status: DNR - Palliative care consulted Family Communication: Pt in room (indicate person spoken with, relationship, and if by phone, the number) Disposition Plan: Pending   Consultants:  Palliative Care  Procedures:    Antibiotics:   (indicate start date, and stop date if known)  HPI/Subjective: No acute events noted overnight  Objective: Filed Vitals:   05/17/14 0216 05/17/14 0655 05/17/14 0913 05/17/14 1419  BP: 164/71 168/61 132/59 135/65  Pulse: 64 70 66 65  Temp: 98 F (36.7 C) 97.5 F (36.4 C) 97.5 F (36.4 C) 97.7 F (36.5 C)  TempSrc: Oral Oral Oral Oral  Resp: 16 18 16 18   Weight:      SpO2: 91% 93% 96% 92%    Intake/Output Summary (Last 24 hours) at 05/17/14 1515 Last data filed at 05/17/14 1200  Gross per 24 hour  Intake    140 ml  Output      0 ml  Net    140 ml   Filed Weights   05/15/14 1330  Weight: 62.642 kg (138 lb 1.6 oz)    Exam:   General:  Awake, in nad  Cardiovascular: regular, s1, s2  Respiratory: normal resp effort, no wheezing  Abdomen: soft,nondistended  Musculoskeletal: perfused, no clubbing   Data Reviewed: Basic Metabolic Panel:  Recent Labs Lab 05/15/14 0900 05/15/14 1530  NA 141  --   K 4.6  --   CL 106  --   CO2 24  --   GLUCOSE 92  --   BUN 25*  --   CREATININE 0.96 0.87  CALCIUM 10.1  --     Liver Function Tests:  Recent Labs Lab 05/15/14 0900  AST 14  ALT 8  ALKPHOS 98  BILITOT 0.5  PROT 6.9  ALBUMIN 3.6   No results for input(s): LIPASE, AMYLASE in the last 168 hours. No results for input(s): AMMONIA in the last 168 hours. CBC:  Recent Labs Lab 05/15/14 0900 05/15/14 1530  WBC 5.7 5.6  NEUTROABS 3.9  --   HGB 12.7 12.8  HCT 38.8 40.0  MCV 99.5 99.5  PLT 184 207   Cardiac Enzymes:  Recent Labs Lab 05/15/14 0900  TROPONINI <0.30   BNP (last 3 results)  Recent Labs  04/29/14 1729  PROBNP 1296.0*   CBG:  Recent Labs Lab 05/15/14 1633 05/16/14 0620 05/16/14 1211 05/16/14 1621  GLUCAP 89 98 96 92    No results found for this or any previous visit (from the past 240 hour(s)).   Studies: Mr Brain Wo Contrast  05/15/2014   CLINICAL DATA:  Transient contusion yesterday, leaning towards the LEFT, generalized weakness.  EXAM: MRI HEAD WITHOUT CONTRAST  TECHNIQUE: Multiplanar, multiecho pulse sequences of the brain and surrounding structures were obtained without intravenous contrast. Patient was unable to complete the examination, axial T2 FLAIR, coronal T2, axial gradient imaging not obtained.  COMPARISON:  CT of the head May 15, 2014  FINDINGS: Subcentimeter foci of  reduced diffusion within RIGHT caudate body, posterior limb of the RIGHT internal capsule and, RIGHT thalamus. Corresponding low ADC values.  Multiple remote RIGHT small cerebellar infarcts. Multiple remote bilateral basal ganglia lacunar infarcts. No midline shift or mass effect.  Moderate to severe ventriculomegaly, likely on the basis of global parenchymal brain volume loss as there is overall commensurate enlargement of cerebral sulci and cerebellar folia, unchanged from prior imaging. Moderate to severe white matter changes suggest chronic small vessel ischemic disease. No abnormal extra-axial fluid collections. Major intracranial vascular flow voids observed at the skull base  color dolichoectatic appearance.  RIGHT suboccipital sebaceous cyst. No abnormal sellar expansion. No cerebellar tonsillar ectopia. No suspicious calvarial bone marrow signal. LEFT occipital probable vascular leak. No paranasal sinus air-fluid levels. The mastoid air cells appear well-aerated.  IMPRESSION: Limited noncontrast MRI of the brain; patient was unable to complete the examination.  Acute subcentimeter lacunar infarcts RIGHT caudate, thalamus and internal capsule.  Involutional changes. Remote bilateral thalamus lacunar infarcts, small remote RIGHT cerebellar infarcts on this mildly motion degraded examination.  Moderate to severe white matter changes likely represent chronic small vessel ischemic disease.   Electronically Signed   By: Awilda Metroourtnay  Bloomer   On: 05/15/2014 23:39   [name]  05/16/2014   CLINICAL DATA:  Hip pain.  EXAM: MR OF THE LEFT HIP WITHOUT CONTRAST  TECHNIQUE: Multiplanar, multisequence MR imaging was performed. No intravenous contrast was administered.  COMPARISON:  Radiographs dated 05/15/2014  FINDINGS: Bones: There is an acute or subacute transverse fracture through the third sacral segment with edema extending into the second and fourth sacral segments. The other pelvic bones are intact. Proximal femurs are intact. There are old healed fractures of the right inferior and superior pubic rami and right pubic body.  There are minimal degenerative changes of the anterior superior aspect of the left acetabulum. There small osteophytes on the acetabulum and left femoral head.  There is no bursitis or joint effusion. There similar findings on the right hip.  The muscles around the hips and pelvis are normal. No adenopathy. 3.3 cm cyst on the left ovary.  IMPRESSION: 1. Acute or subacute transverse fracture through the third sacral segment. No other acute osseous abnormalities. 2. 3.3 cm simple appearing cyst on the left ovary.   Electronically Signed   By: Geanie CooleyJim  Maxwell  M.D.   On: 05/16/2014 11:04    Scheduled Meds: . aspirin  325 mg Oral Daily  . donepezil  10 mg Oral QHS  . feeding supplement (ENSURE COMPLETE)  237 mL Oral Daily  . ferrous sulfate  325 mg Oral Q breakfast  . heparin  5,000 Units Subcutaneous 3 times per day  . levothyroxine  25 mcg Oral QAC breakfast  . multivitamin-lutein  1 capsule Oral BID  . pantoprazole  40 mg Oral Daily  . polyethylene glycol  17 g Oral Daily  . sertraline  75 mg Oral Daily   Continuous Infusions:   Principal Problem:   TIA (transient ischemic attack) Active Problems:   Hypothyroidism   Dementia   General weakness   Hip pain, bilateral   Acute CVA (cerebrovascular accident)  Time spent: 30min  Josephyne Tarter K  [facility] Pager [phone]. If 7PM-7AM, please contact night-coverage at [email or web address], password [email or web address] 05/17/2014, 3:15 PM  LOS: 2 days

## 2014-05-17 NOTE — Progress Notes (Signed)
SLP Cancellation Note  Patient Details Name: Rebecca Dawson MRN: [id number] DOB: 06/01/1917   Cancelled treatment:       Reason Eval/Treat Not Completed: SLP screened, no needs identified, will sign off. Pt passed RN stroke swallow screen and per RN there are no overt concerns for swallowing at this time. Per protocol, will defer bedside swallow evaluation at this time. Please re-order as needed for swallow and/or cognitive-linguistic evaluation.    Maxcine HamLaura Paiewonsky, M.A. CCC-SLP [phone]  Maxcine Hamaiewonsky, [name] 05/17/2014, 10:16 AM

## 2014-05-18 DIAGNOSIS — E038 Other specified hypothyroidism: Secondary | ICD-10-CM

## 2014-05-18 LAB — TROPONIN I: Troponin I: <0.3 ng/mL (ref <0.30)

## 2014-05-18 NOTE — Progress Notes (Signed)
[facility] PROGRESS NOTE  Rebecca Dawson UJW:[id number] DOB: 03/07/1918 DOA: 05/15/2014 PCP: Ginette OttoSTONEKING,HAL THOMAS, MD  Assessment/Plan: 1. Acute infarct to R caudate, thalamus, internal capsule 1. See earlier note, family declined further stroke work up 2. Palliative care consulted to assist with GOC and for placement, appreciate input in this unfortunate case 2. B hip pain 1. Sacral fractures seen on recent MRI 2. Not considered candidate for aggressive measures, thus conservative measures recommended 3. Hypothyroid 1. Cont on thyroid replacement as tolerated 4. Dementia 1. Stable 2. On aricept 5. DVT prophylaixs 1. Heparin  Code Status: DNR - Palliative care consulted Family Communication: Pt in room  Disposition Plan: Pending   Consultants:  Palliative Care  Procedures:    Antibiotics:     HPI/Subjective: No acute events noted overnight. Pt complains of mild back pain  Objective: Filed Vitals:   05/17/14 2104 05/18/14 0328 05/18/14 0557 05/18/14 0952  BP: 169/61 135/61 156/64 142/50  Pulse: 63 60 55 63  Temp: 97.5 F (36.4 C) 97.5 F (36.4 C) 97.7 F (36.5 C) 98.8 F (37.1 C)  TempSrc: Oral Oral Oral Oral  Resp: 18 16 16 16   Weight:      SpO2: 93% 92% 92% 96%   No intake or output data in the 24 hours ending 05/18/14 1327 Filed Weights   05/15/14 1330  Weight: 62.642 kg (138 lb 1.6 oz)    Exam:   General:  Awake, in nad  Cardiovascular: regular, s1, s2  Respiratory: normal resp effort, no wheezing  Abdomen: soft,nondistended  Musculoskeletal: perfused, no clubbing   Data Reviewed: Basic Metabolic Panel:  Recent Labs Lab 05/15/14 0900 05/15/14 1530  NA 141  --   K 4.6  --   CL 106  --   CO2 24  --   GLUCOSE 92  --   BUN 25*  --   CREATININE 0.96 0.87  CALCIUM 10.1  --    Liver Function Tests:  Recent Labs Lab 05/15/14 0900  AST 14  ALT 8  ALKPHOS 98  BILITOT 0.5  PROT 6.9  ALBUMIN 3.6   No results for  input(s): LIPASE, AMYLASE in the last 168 hours. No results for input(s): AMMONIA in the last 168 hours. CBC:  Recent Labs Lab 05/15/14 0900 05/15/14 1530  WBC 5.7 5.6  NEUTROABS 3.9  --   HGB 12.7 12.8  HCT 38.8 40.0  MCV 99.5 99.5  PLT 184 207   Cardiac Enzymes:  Recent Labs Lab 05/15/14 0900  TROPONINI <0.30   BNP (last 3 results)  Recent Labs  04/29/14 1729  PROBNP 1296.0*   CBG:  Recent Labs Lab 05/15/14 1633 05/16/14 0620 05/16/14 1211 05/16/14 1621  GLUCAP 89 98 96 92    No results found for this or any previous visit (from the past 240 hour(s)).   Studies: No results found.  Scheduled Meds: . aspirin  325 mg Oral Daily  . donepezil  10 mg Oral QHS  . feeding supplement (ENSURE COMPLETE)  237 mL Oral Daily  . ferrous sulfate  325 mg Oral Q breakfast  . heparin  5,000 Units Subcutaneous 3 times per day  . levothyroxine  25 mcg Oral QAC breakfast  . multivitamin-lutein  1 capsule Oral BID  . pantoprazole  40 mg Oral Daily  . polyethylene glycol  17 g Oral Daily  . sertraline  75 mg Oral Daily   Continuous Infusions:   Principal Problem:   TIA (transient ischemic attack) Active Problems:  Hypothyroidism   Dementia   General weakness   Hip pain, bilateral   Acute CVA (cerebrovascular accident)  Time spent: 30min  CHIU, STEPHEN K  [facility] Pager [phone]. If 7PM-7AM, please contact night-coverage at [email or web address], password [email or web address] 05/18/2014, 1:27 PM  LOS: 3 days

## 2014-05-19 LAB — TROPONIN I
Troponin I: <0.3 ng/mL (ref <0.30)
Troponin I: <0.3 ng/mL (ref <0.30)

## 2014-05-19 NOTE — Progress Notes (Signed)
PT Cancellation Note  Patient Details Name: Rebecca Dawson MRN: [id number] DOB: 09/24/1917   Cancelled Treatment:    Reason Eval/Treat Not Completed: Other (comment). Spoke with palliative care coordinator who stated that the son wanted comfort care for patient and that family has decided for no further stroke follow. Unsure if PT still appropriate at this time but will attempt to follow up with son tomorrow. Per palliative care coordinator, she believes that hospice may be the best choice for Rebecca Dawson. Will follow up tomorrow as appropriate   Fredrich BirksRobinette, Nikeya Maxim Elizabeth 05/19/2014, 1:51 PM

## 2014-05-19 NOTE — Progress Notes (Signed)
[facility] PROGRESS NOTE  Rebecca Dawson ZOX:[id number] DOB: 11/27/1917 DOA: 05/15/2014 PCP: Ginette OttoSTONEKING,HAL THOMAS, MD  Assessment/Plan: 1. Acute infarct to R caudate, thalamus, internal capsule 1. See earlier note, family had declined further stroke work up 2. Palliative care was consulted to assist with GOC and for placement, will appreciate palliative input in this unfortunate case 2. B hip pain 1. Sacral fractures seen on recent MRI 2. Not considered candidate for aggressive measures, thus conservative measures recommended 3. Hypothyroid 1. Cont on thyroid replacement as tolerated 4. Dementia 1. Stable 2. On aricept 5. DVT prophylaixs 1. Heparin  Code Status: DNR - Palliative care consulted Family Communication: Pt in room  Disposition Plan: Pending   Consultants:  Palliative Care  Procedures:    Antibiotics:     HPI/Subjective: No acute events noted overnight. Pt is without complaints this AM  Objective: Filed Vitals:   05/18/14 1738 05/18/14 2110 05/19/14 0102 05/19/14 0617  BP: 148/93 135/48 117/47 134/54  Pulse: 81 65 73 64  Temp: 98.4 F (36.9 C) 98.1 F (36.7 C) 98 F (36.7 C) 97.7 F (36.5 C)  TempSrc: Oral Oral Oral Oral  Resp: 18 18 18 18   Weight:      SpO2: 96% 94% 95% 91%   No intake or output data in the 24 hours ending 05/19/14 1434 Filed Weights   05/15/14 1330  Weight: 62.642 kg (138 lb 1.6 oz)    Exam:   General:  Awake, in nad  Cardiovascular: regular, s1, s2  Respiratory: normal resp effort, no wheezing  Abdomen: soft,nondistended  Musculoskeletal: perfused, no clubbing   Data Reviewed: Basic Metabolic Panel:  Recent Labs Lab 05/15/14 0900 05/15/14 1530  NA 141  --   K 4.6  --   CL 106  --   CO2 24  --   GLUCOSE 92  --   BUN 25*  --   CREATININE 0.96 0.87  CALCIUM 10.1  --    Liver Function Tests:  Recent Labs Lab 05/15/14 0900  AST 14  ALT 8  ALKPHOS 98  BILITOT 0.5  PROT 6.9  ALBUMIN 3.6    No results for input(s): LIPASE, AMYLASE in the last 168 hours. No results for input(s): AMMONIA in the last 168 hours. CBC:  Recent Labs Lab 05/15/14 0900 05/15/14 1530  WBC 5.7 5.6  NEUTROABS 3.9  --   HGB 12.7 12.8  HCT 38.8 40.0  MCV 99.5 99.5  PLT 184 207   Cardiac Enzymes:  Recent Labs Lab 05/15/14 0900 05/18/14 1829 05/19/14 0010 05/19/14 0558  TROPONINI <0.30 <0.30 <0.30 <0.30   BNP (last 3 results)  Recent Labs  04/29/14 1729  PROBNP 1296.0*   CBG:  Recent Labs Lab 05/15/14 1633 05/16/14 0620 05/16/14 1211 05/16/14 1621  GLUCAP 89 98 96 92    No results found for this or any previous visit (from the past 240 hour(s)).   Studies: No results found.  Scheduled Meds: . aspirin  325 mg Oral Daily  . donepezil  10 mg Oral QHS  . feeding supplement (ENSURE COMPLETE)  237 mL Oral Daily  . ferrous sulfate  325 mg Oral Q breakfast  . heparin  5,000 Units Subcutaneous 3 times per day  . levothyroxine  25 mcg Oral QAC breakfast  . multivitamin-lutein  1 capsule Oral BID  . pantoprazole  40 mg Oral Daily  . polyethylene glycol  17 g Oral Daily  . sertraline  75 mg Oral Daily   Continuous Infusions:  Principal Problem:   TIA (transient ischemic attack) Active Problems:   Hypothyroidism   Dementia   General weakness   Hip pain, bilateral   Acute CVA (cerebrovascular accident)  Time spent: 30min  Neosha Switalski K  [facility] Pager [phone]. If 7PM-7AM, please contact night-coverage at [email or web address], password [email or web address] 05/19/2014, 2:34 PM  LOS: 4 days

## 2014-05-19 NOTE — Progress Notes (Signed)
Spoke with patient's daughter-in-law and gave [place] to [place]. Wanted to know how patient was doing and if she wold be going back [facility] in the [facility] living portion of the facility. Told her that tomorrow they would re-evaluate and also determine if physical therapy would occur while she is put on palliative care. Suzy Bouchardhompson, Aminat Shelburne E, RN  05/19/2014 8:30 PM

## 2014-05-19 NOTE — Progress Notes (Signed)
12/21 Met patient at bedside, she is weak but oriented  to name and place.  When asked about pain she replied "pain, I don't want to talk about pain" I explained that our goal was that she be comfortable, she changed the subject and asked for a drink.  No obvious signs of pain when she was adjusted in bed to drink.  Her tray was untouched on the bedside table.  I offered to feed her some peaches and she agreed.  She was able to hold her drink cup in her right hand with little help, left had and arm could only be lifted very slightly off of the bed.  Swallow of ice water without difficulty and she did eat a few bites of peaches then said she was done.  At this time she does not have any intense symptom management needs. She does not appear to be appropriate for rehab at this time, previous notes indicate that her son did not want further work up for condition.    Return to long term care facility with hospice services is recommended with MOST orders to prevent readmission.  She also may be a candidate for residential/inpatient hospice facility, referral would be recommended for hospice to weigh in on both potential transitions.  Will attempt to contact son (out of state) by phone, although the chart does not indicate that he has written HCPOA.  Will follow up note with his input after completed.    Kizzie Fantasia, RN, MSN, [facility] [phone]

## 2014-05-20 MED ORDER — ASPIRIN 325 MG PO TABS: 325.0000 mg | ORAL_TABLET | Freq: Every day | ORAL | Status: AC

## 2014-05-20 NOTE — Discharge Summary (Signed)
Physician Discharge Summary  Rebecca Dawson GNF:[id number] DOB: Mar 24, 1918 DOA: 05/15/2014  PCP: Rebecca Otto, MD  Admit date: 05/15/2014 Discharge date: 05/20/2014  Time spent: 30 minutes  Recommendations for Outpatient Follow-up:  1. Follow up with PCP as needed  Discharge Diagnoses:  Principal Problem:   TIA (transient ischemic attack) Active Problems:   Hypothyroidism   Dementia   General weakness   Hip pain, bilateral   Acute CVA (cerebrovascular accident)   Discharge Condition: Stable  Diet recommendation: Regular, thin liquids  Filed Weights   05/15/14 1330 05/20/14 0500  Weight: 62.642 kg (138 lb 1.6 oz) 59.285 kg (130 lb 11.2 oz)    History of present illness:  Please see admit h and p from 12/17 for details. Briefly, pt presents with increased confusion and weakness. The patient was admitted for further work up.  Hospital Course:  1. Acute infarct to R caudate, thalamus, internal capsule 1. See earlier note, family recently had declined further stroke work up 2. Palliative care was therefore consulted to assist with GOC and for placement 3. Plans for return to ALF with hospice care 4. The patient is otherwise continued on 325mg  aspirin and has remained stable thus far 2. B hip pain 1. Sacral fractures seen on recent MRI 2. Not considered candidate for aggressive measures, thus conservative measures recommended 3. Hypothyroid 1. Cont on thyroid replacement as tolerated 4. Dementia 1. Stable 2. On aricept 5. DVT prophylaixs 1. Heparin  Consultations:  Palliative Care  Discharge Exam: Filed Vitals:   05/20/14 0558 05/20/14 1013 05/20/14 1226 05/20/14 1429  BP: 129/41 126/54 120/65 122/68  Pulse: 64 73 83 80  Temp: 98.1 F (36.7 C) 98.6 F (37 C) 98.4 F (36.9 C) 98.1 F (36.7 C)  TempSrc: Oral Oral Oral Oral  Resp: 16 16 14 16   Height:      Weight:      SpO2: 96% 96% 94% 95%    General: awake, in nad Cardiovascular: regular, s1,  s2 Respiratory: normal resp effort, no wheezing  Discharge Instructions     Medication List    STOP taking these medications        aspirin EC 81 MG tablet  Replaced by:  aspirin 325 MG tablet      TAKE these medications        acetaminophen 500 MG tablet  Commonly known as:  TYLENOL  Take 500 mg by mouth 3 (three) times daily as needed for pain (takes three times a day and as needed).     aspirin 325 MG tablet  Take 1 tablet (325 mg total) by mouth daily.     donepezil 10 MG tablet  Commonly known as:  ARICEPT  Take 10 mg by mouth at bedtime.     ENSURE  Take 1 Can by mouth daily. chocolate     ferrous sulfate 325 (65 FE) MG tablet  Take 325 mg by mouth daily with breakfast.     HYDROcodone-acetaminophen 5-325 MG per tablet  Commonly known as:  NORCO/VICODIN  Take 1 tablet by mouth every 4 (four) hours as needed for moderate pain.     ICAPS AREDS FORMULA PO  Take 1 capsule by mouth 2 (two) times daily.     levothyroxine 25 MCG tablet  Commonly known as:  SYNTHROID, LEVOTHROID  Take 25 mcg by mouth every morning.     omeprazole 20 MG capsule  Commonly known as:  PRILOSEC  Take 20 mg by mouth daily.  OYSTER SHELL CALCIUM 500 + D PO  Take 1 tablet by mouth daily.     polyethylene glycol packet  Commonly known as:  MIRALAX / GLYCOLAX  Take 17 g by mouth daily. For constipation     sertraline 50 MG tablet  Commonly known as:  ZOLOFT  Take 75 mg by mouth daily.     traMADol 50 MG tablet  Commonly known as:  ULTRAM  Take 50 mg by mouth every 6 (six) hours as needed for pain.       Allergies  Allergen Reactions  . Penicillins     Unknown reaction.   Follow-up Information    Follow up with Rebecca OttoSTONEKING,HAL THOMAS, MD. Schedule an appointment as soon as possible for a visit in 1 week.   Specialty:  Internal Medicine   Contact information:   [address] [place] [address] [phone]        The results of significant  diagnostics from this hospitalization (including imaging, microbiology, ancillary and laboratory) are listed below for reference.    Significant Diagnostic Studies: Dg Chest 2 View  05/15/2014   CLINICAL DATA:  Weakness  EXAM: CHEST  2 VIEW  COMPARISON:  04/29/2014  FINDINGS: Heart size upper normal. Negative for heart failure. Lungs are clear without infiltrate or effusion. Atherosclerotic disease in the aortic arch  Advanced degenerative change in the right shoulder joint rotator cuff tear and prior rotator cuff repair. Degenerative change in the left shoulder.  IMPRESSION: No active cardiopulmonary disease.   Electronically Signed   By: Marlan Palauharles  Clark M.D.   On: 05/15/2014 08:51   Dg Chest 2 View  04/29/2014   CLINICAL DATA:  Left lower chest pain for 2 days. Shortness of breath.  EXAM: CHEST  2 VIEW  COMPARISON:  04/21/2013 and 12/08/2013  FINDINGS: There is chronic cardiomegaly with tortuosity and calcification of the thoracic aorta. Pulmonary vascularity is normal. There is chronic accentuation of the interstitial markings in both lungs. No infiltrates or effusions. Chronic accentuation of the thoracic kyphosis. Diffuse osteopenia. No visible fractures. Arthritis of both shoulders.  The lungs are hyperinflated consistent with emphysema.  IMPRESSION: Emphysema.  No acute abnormalities.   Electronically Signed   By: Geanie CooleyJim  Maxwell M.D.   On: 04/29/2014 17:20   Dg Hip Bilateral W/pelvis  05/15/2014   CLINICAL DATA:  Weakness, pain in the hips  EXAM: BILATERAL HIP WITH PELVIS - 4+ VIEW  COMPARISON:  CT abdomen pelvis of 04/14/2011 and pelvis right hip films of 12/08/2013  FINDINGS: Old right pelvic rami fractures again are noted. The bones are diffusely osteopenic. No obvious hip fracture is seen. However on one view there is slight angulation of the cortex of the femoral head /neck superiorly and a subtle fracture would be difficult to exclude. Recommend MRI of this region if further assessment is felt  necessary clinically. There are degenerative changes present in the lower lumbar spine.  IMPRESSION: 1. No definite fracture but slight cortical irregularity of the head/neck of the left femur superiorly is suspicious for nondisplaced fracture, and MRI may be helpful if further assessment is warranted. 2. Diffuse osteopenia. 3. Old fracture deformity of the right pelvic ramus.   Electronically Signed   By: Dwyane DeePaul  Barry M.D.   On: 05/15/2014 15:07   Ct Head Wo Contrast  05/15/2014   CLINICAL DATA:  Altered mental status  EXAM: CT HEAD WITHOUT CONTRAST  TECHNIQUE: Contiguous axial images were obtained from the base of the skull through  the vertex without intravenous contrast.  COMPARISON:  April 21, 2013  FINDINGS: There is moderate diffuse atrophy. Is no intracranial mass, hemorrhage, extra-axial fluid collection, or midline shift. There is small vessel disease throughout the centra semiovale bilaterally. Small vessel disease is noted in both posterior limbs of the internal capsules. There are small infarcts in the medial right thalamus. Small vessel disease is also noted in the left thalamus. No acute appearing infarct is seen on this study.  Bones are osteoporotic. The bony calvarium appears intact. The mastoid air cells are clear. There is a calcium containing sebaceous cyst over the right occipital bone measuring 1.9 x 1.3 cm.  IMPRESSION: Atrophy with prior small infarcts and extensive supratentorial small vessel disease. No intracranial mass, hemorrhage, or acute appearing infarct. Stable sebaceous cyst containing calcification, right occipital area.   Electronically Signed   By: Bretta Bang M.D.   On: 05/15/2014 09:07   Mr Brain Wo Contrast  05/15/2014   CLINICAL DATA:  Transient contusion yesterday, leaning towards the LEFT, generalized weakness.  EXAM: MRI HEAD WITHOUT CONTRAST  TECHNIQUE: Multiplanar, multiecho pulse sequences of the brain and surrounding structures were obtained without  intravenous contrast. Patient was unable to complete the examination, axial T2 FLAIR, coronal T2, axial gradient imaging not obtained.  COMPARISON:  CT of the head May 15, 2014  FINDINGS: Subcentimeter foci of reduced diffusion within RIGHT caudate body, posterior limb of the RIGHT internal capsule and, RIGHT thalamus. Corresponding low ADC values.  Multiple remote RIGHT small cerebellar infarcts. Multiple remote bilateral basal ganglia lacunar infarcts. No midline shift or mass effect.  Moderate to severe ventriculomegaly, likely on the basis of global parenchymal brain volume loss as there is overall commensurate enlargement of cerebral sulci and cerebellar folia, unchanged from prior imaging. Moderate to severe white matter changes suggest chronic small vessel ischemic disease. No abnormal extra-axial fluid collections. Major intracranial vascular flow voids observed at the skull base color dolichoectatic appearance.  RIGHT suboccipital sebaceous cyst. No abnormal sellar expansion. No cerebellar tonsillar ectopia. No suspicious calvarial bone marrow signal. LEFT occipital probable vascular leak. No paranasal sinus air-fluid levels. The mastoid air cells appear well-aerated.  IMPRESSION: Limited noncontrast MRI of the brain; patient was unable to complete the examination.  Acute subcentimeter lacunar infarcts RIGHT caudate, thalamus and internal capsule.  Involutional changes. Remote bilateral thalamus lacunar infarcts, small remote RIGHT cerebellar infarcts on this mildly motion degraded examination.  Moderate to severe white matter changes likely represent chronic small vessel ischemic disease.   Electronically Signed   By: Awilda Metro   On: 05/15/2014 23:39   [name]  05/16/2014   CLINICAL DATA:  Hip pain.  EXAM: MR OF THE LEFT HIP WITHOUT CONTRAST  TECHNIQUE: Multiplanar, multisequence MR imaging was performed. No intravenous contrast was administered.  COMPARISON:  Radiographs  dated 05/15/2014  FINDINGS: Bones: There is an acute or subacute transverse fracture through the third sacral segment with edema extending into the second and fourth sacral segments. The other pelvic bones are intact. Proximal femurs are intact. There are old healed fractures of the right inferior and superior pubic rami and right pubic body.  There are minimal degenerative changes of the anterior superior aspect of the left acetabulum. There small osteophytes on the acetabulum and left femoral head.  There is no bursitis or joint effusion. There similar findings on the right hip.  The muscles around the hips and pelvis are normal. No adenopathy. 3.3 cm cyst on the  left ovary.  IMPRESSION: 1. Acute or subacute transverse fracture through the third sacral segment. No other acute osseous abnormalities. 2. 3.3 cm simple appearing cyst on the left ovary.   Electronically Signed   By: Geanie CooleyJim  Maxwell M.D.   On: 05/16/2014 11:04    Microbiology: No results found for this or any previous visit (from the past 240 hour(s)).   Labs: Basic Metabolic Panel:  Recent Labs Lab 05/15/14 0900 05/15/14 1530  NA 141  --   K 4.6  --   CL 106  --   CO2 24  --   GLUCOSE 92  --   BUN 25*  --   CREATININE 0.96 0.87  CALCIUM 10.1  --    Liver Function Tests:  Recent Labs Lab 05/15/14 0900  AST 14  ALT 8  ALKPHOS 98  BILITOT 0.5  PROT 6.9  ALBUMIN 3.6   No results for input(s): LIPASE, AMYLASE in the last 168 hours. No results for input(s): AMMONIA in the last 168 hours. CBC:  Recent Labs Lab 05/15/14 0900 05/15/14 1530  WBC 5.7 5.6  NEUTROABS 3.9  --   HGB 12.7 12.8  HCT 38.8 40.0  MCV 99.5 99.5  PLT 184 207   Cardiac Enzymes:  Recent Labs Lab 05/15/14 0900 05/18/14 1829 05/19/14 0010 05/19/14 0558  TROPONINI <0.30 <0.30 <0.30 <0.30   BNP: BNP (last 3 results)  Recent Labs  04/29/14 1729  PROBNP 1296.0*   CBG:  Recent Labs Lab 05/15/14 1633 05/16/14 0620 05/16/14 1211  05/16/14 1621  GLUCAP 89 98 96 92   Signed:  Zakhi Dupre K  [facility] 05/20/2014, 4:44 PM

## 2014-05-20 NOTE — Progress Notes (Signed)
[facility] in [facility]

## 2014-05-20 NOTE — Progress Notes (Signed)
Pt turned and repositioned frequently.  Pt denies pain or discomfort at this time.

## 2014-05-20 NOTE — Progress Notes (Deleted)
CARE MANAGEMENT NOTE 05/20/2014  Patient:  HEATWOLE,EDWARD   Account Number:  [id number]  Date Initiated:  05/20/2014  Documentation initiated by:  Kash Mothershead  Subjective/Objective Assessment:   ADMITTED WITH STROKE     Action/Plan:   CM FOLLOWING FOR DCP   Anticipated DC Date:  05/24/2014   Anticipated DC Plan:  AWAITING FOR PT/OT EVALS FOR DISPOSITION NEEDS     DC Planning Services  CM consult         Status of service:  In process, will continue to follow Medicare Important Message given?   (If response is "NO", the following Medicare IM given date fields will be blank)  Per UR Regulation:  Reviewed for med. necessity/level of care/duration of stay  Comments:  05/20/2014- B Lailyn Appelbaum RN,BSN,MHA [phone]  

## 2014-05-20 NOTE — Progress Notes (Signed)
PT Cancellation Note  Patient Details Name: Rondel Ohhyllis F Gundrum MRN: [id number] DOB: 02/28/1918   Cancelled Treatment:    Reason Eval/Treat Not Completed: Other (comment). Patient is currently on bedrest and is planning to DC back to ALF on hospice per RNs report. Will follow up for any possible changes   Shaughn Thomley, Adline PotterJulia Elizabeth 05/20/2014, 1:52 PM

## 2014-05-20 NOTE — Progress Notes (Signed)
Nutrition Brief Note  Chart reviewed. Current goal for the pt is primarily comfort. Continue with current nutrition intervention. No further nutrition interventions warranted at this time. Please re-consult as needed.   Marijean NiemannStephanie La, MS, RD, LDN Pager # [phone] After hours/ weekend pager # [phone]

## 2014-05-20 NOTE — Clinical Social Work Note (Addendum)
CSW spoke with Rebecca Dawson from [facility] [place] regarding the pt transition back to her ALF with hospice. Rebecca Dawson asked to spoke with Rebecca PresumeSue Ellen Grounds, RN to discuss goals of care. CSW faxed Rebecca Dawson the pt's clinicals. Rebecca Dawson reported that once she talk to Geraldine SolarSue Ellen she will review the clinicals with her staff then inform the CSW to weather they can take the pt. Rebecca [facility]. CSW provided the  the case manager with an update.   AddendumJoni Reining: Nicole requested to visit the pt today before 3pm to evaluate if the pt can transition back to ALF with hospice.   Addendum: [facility] [place] [facility] stating [facility] can not take the pt. CSW called Kathlene NovemberMike to finalize his decision to which residential hospice agency the family is interested in. Kathlene NovemberMike reported he was informed by Rebecca ReiningNicole that [facility]. CSW and Kathlene NovemberMike discussed reasoning to why Rebecca [facility]Nicole [facility]. CSW and Kathlene NovemberMike had a lengthy conversation with hospice.   CSW spoke with Dr. Jacky KindleAronson regarding d/c barriers.   CSW and Kathlene NovemberMike dicussed the pt  d/c to [facility] today. CSW will contact [facility] at [phone] to schedule transport for the pt. CSW upload the pt's discharge summary to [place]. Bedside RN can call reported to [phone]. CSW informed case manager that the pt will transition to [facility]. Case manager reported she will informed hospice.    Pola Furno, MSW, LCSWA [phone]

## 2014-05-21 NOTE — Progress Notes (Signed)
[facility] at [facility]: Rebecca GoodellB Rebecca Busey RN,BSN,MHA [phone]

## 2014-07-29 DEATH — deceased

## 2015-04-21 IMAGING — DX DG HIP W/ PELVIS BILAT
5 series · 5 of 5 positions shown · non-contrast
Comparison: CT abdomen pelvis of 04/14/2011 and pelvis right hip
films of 12/08/2013

CLINICAL DATA: Weakness, pain in the hips

EXAM:
BILATERAL HIP WITH PELVIS - 4+ VIEW

[pelvis ap]
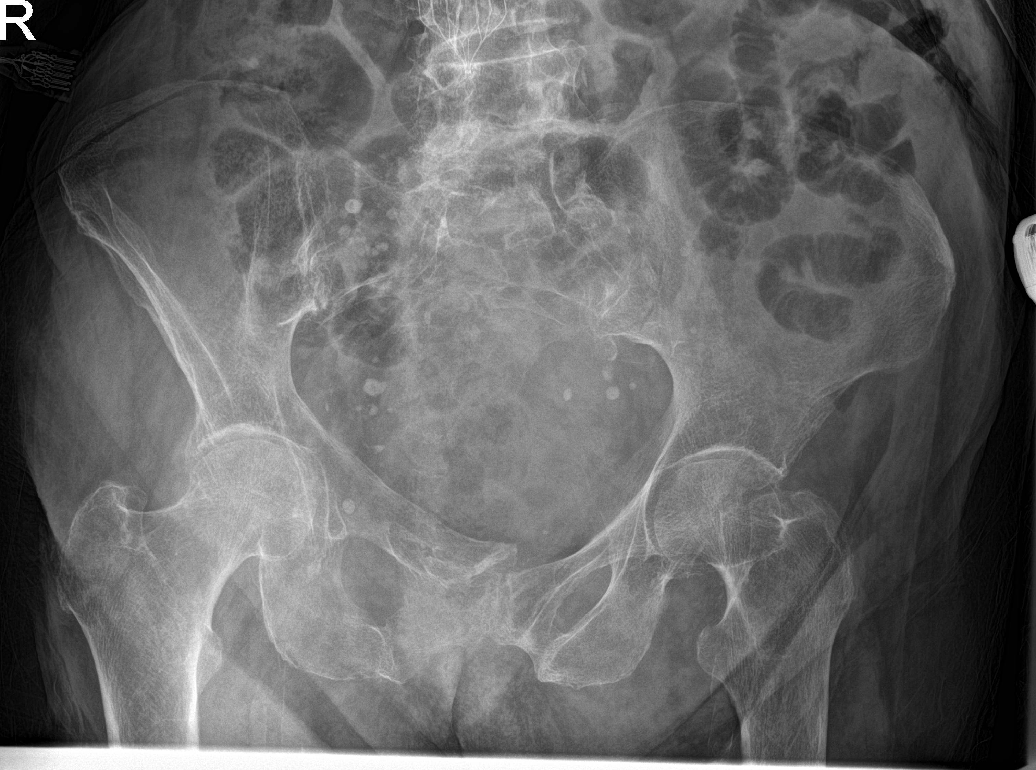

[hip ap (1 of 2)]
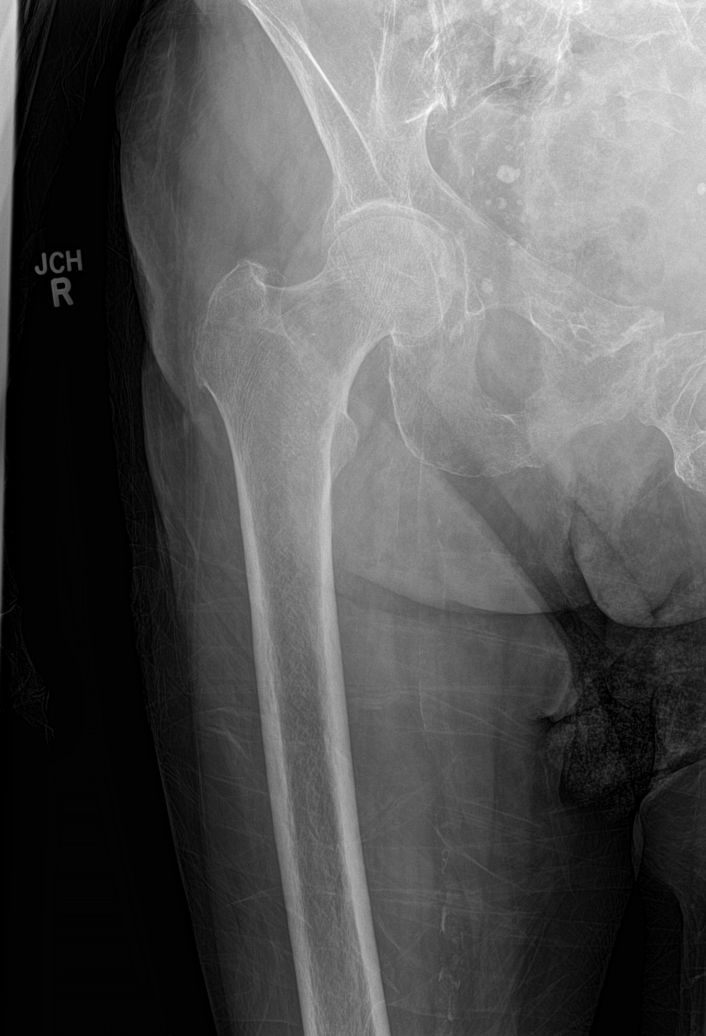

[hip ap (2 of 2)]
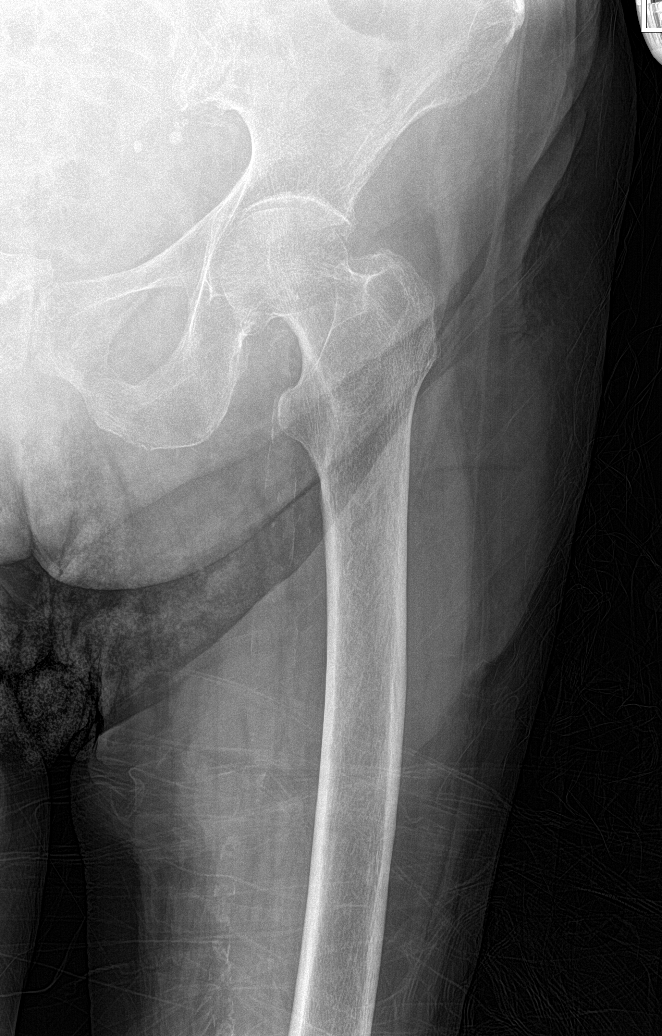

[hip lat (1 of 2)]
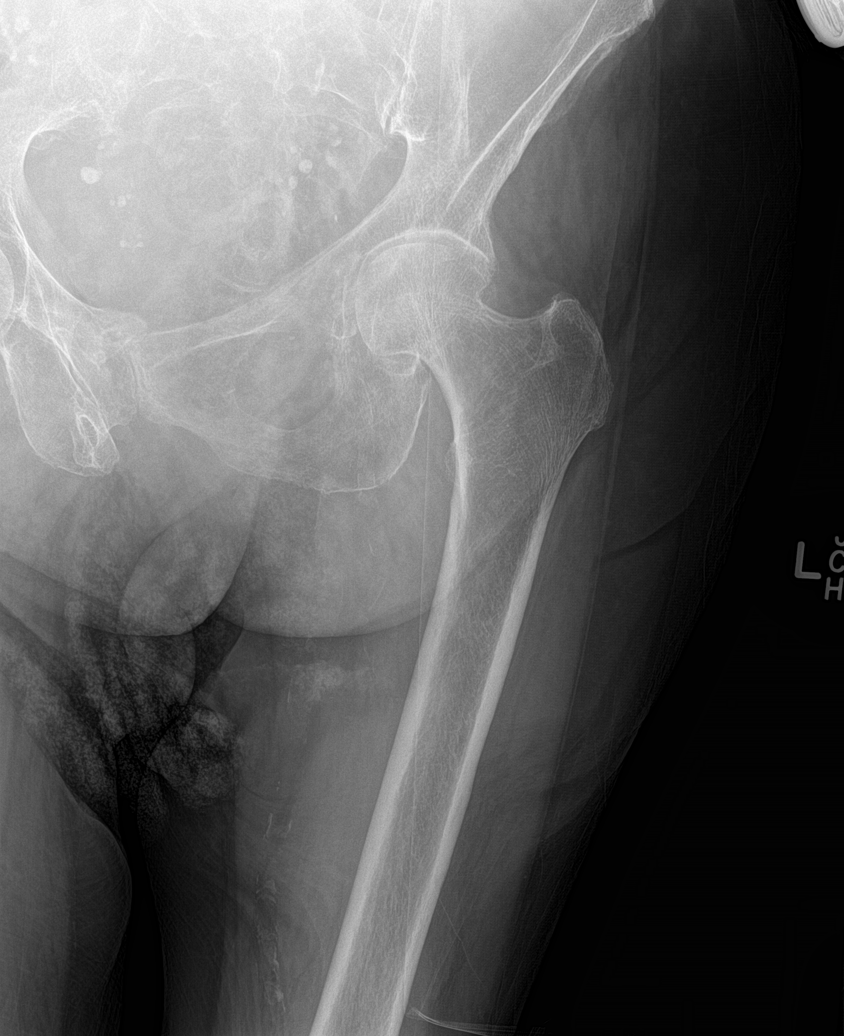

[hip lat (2 of 2)]
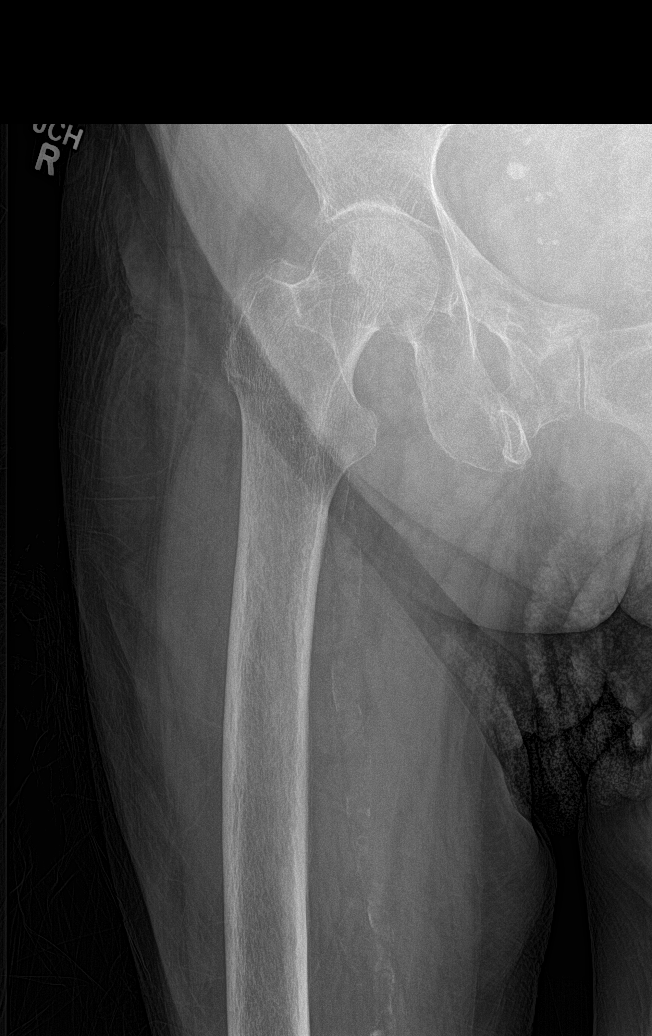

[5 of 5 positions shown; findings below may reference images not displayed]

FINDINGS: Old right pelvic rami fractures again are noted. The bones are
diffusely osteopenic. No obvious hip fracture is seen. However on
one view there is slight angulation of the cortex of the femoral
head /neck superiorly and a subtle fracture would be difficult to
exclude. Recommend MRI of this region if further assessment is felt
necessary clinically. There are degenerative changes present in the
lower lumbar spine.
IMPRESSION: 1. No definite fracture but slight cortical irregularity of the
head/neck of the left femur superiorly is suspicious for
nondisplaced fracture, and MRI may be helpful if further assessment
is warranted.
2. Diffuse osteopenia.
3. Old fracture deformity of the right pelvic ramus.
